# Patient Record
Sex: Male | Born: 1975 | Race: White | Hispanic: No | Marital: Married | Smoking: Former smoker
Health system: Southern US, Community
[De-identification: ages and names within clinical notes are randomized; demographics above are authoritative.]

## PROBLEM LIST (undated history)

## (undated) DIAGNOSIS — K219 Gastro-esophageal reflux disease without esophagitis: Secondary | ICD-10-CM

## (undated) DIAGNOSIS — F419 Anxiety disorder, unspecified: Secondary | ICD-10-CM

## (undated) DIAGNOSIS — I10 Essential (primary) hypertension: Secondary | ICD-10-CM

## (undated) HISTORY — DX: Anxiety disorder, unspecified: F41.9

## (undated) HISTORY — DX: Essential (primary) hypertension: I10

## (undated) HISTORY — PX: NO PAST SURGERIES: SHX2092

## (undated) HISTORY — DX: Gastro-esophageal reflux disease without esophagitis: K21.9

---

## 2012-11-30 ENCOUNTER — Ambulatory Visit (INDEPENDENT_AMBULATORY_CARE_PROVIDER_SITE_OTHER): Payer: BC Managed Care – PPO | Admitting: Emergency Medicine

## 2012-11-30 ENCOUNTER — Ambulatory Visit: Payer: BC Managed Care – PPO

## 2012-11-30 VITALS — BP 130/100 | HR 80 | Temp 98.3°F | Resp 18 | Ht 72.0 in

## 2012-11-30 DIAGNOSIS — M25579 Pain in unspecified ankle and joints of unspecified foot: Secondary | ICD-10-CM

## 2012-11-30 DIAGNOSIS — S93409A Sprain of unspecified ligament of unspecified ankle, initial encounter: Secondary | ICD-10-CM

## 2012-11-30 MED ORDER — HYDROCODONE-ACETAMINOPHEN 5-325 MG PO TABS: 1.0000 | ORAL_TABLET | ORAL | Status: DC | PRN

## 2012-11-30 NOTE — Progress Notes (Signed)
[facility] [address], [place] [address] [phone]  Date:  11/30/2012   Name:  Mark Dorsey   DOB:  1976-06-24   MRN:  [id number]  PCP:  No primary provider on file.    Chief Complaint: Ankle Pain   History of Present Illness:  Mark Dorsey is a 37 y.o. very pleasant male patient who presents with the following:  Twisted right ankle when he slipped on the ice about 2 hours ago.  Felt and heard a pop and is now unable to bear weight.    There is no problem list on file for this patient.   History reviewed. No pertinent past medical history.  History reviewed. No pertinent past surgical history.  History  Substance Use Topics  . Smoking status: Former Games developer  . Smokeless tobacco: Not on file  . Alcohol Use: Not on file    Family History  Problem Relation Age of Onset  . Diabetes Father   . Hypertension Father   . Hypertension Sister   . Hypertension Paternal Grandmother   . Asthma Paternal Grandmother     Allergies  Allergen Reactions  . Penicillins     Medication list has been reviewed and updated.  No current outpatient prescriptions on file prior to visit.    Review of Systems:  As per HPI, otherwise negative.    Physical Examination: Filed Vitals:   11/30/12 1855  BP: 130/100  Pulse: 80  Temp: 98.3 F (36.8 C)  Resp: 18   Filed Vitals:   11/30/12 1855  Height: 6' (1.829 m)   There is no weight on file to calculate BMI. Ideal Body Weight: Weight in (lb) to have BMI = 25: 183.9    GEN: WDWN, NAD, Non-toxic, Alert & Oriented x 3 HEENT: Atraumatic, Normocephalic.  Ears and Nose: No external deformity. EXTR: No clubbing/cyanosis/edema NEURO: Normal gait.  PSYCH: Normally interactive. Conversant. Not depressed or anxious appearing.  Calm demeanor.  ANKLE:  Right ankle lateral malleolus swollen and tender. No ecchymotic.  Guards too much to evaluate stability.  Assessment and Plan: Sprain ankle Air  cast Crutches WBAT vicodin Follow up in one week  Carmelina Dane, MD

## 2012-11-30 NOTE — Patient Instructions (Signed)
Ankle Sprain  An ankle sprain is an injury to the strong, fibrous tissues (ligaments) that hold the bones of your ankle joint together.   CAUSES  An ankle sprain is usually caused by a fall or by twisting your ankle. Ankle sprains most commonly occur when you step on the outer edge of your foot, and your ankle turns inward. People who participate in sports are more prone to these types of injuries.   SYMPTOMS    Pain in your ankle. The pain may be present at rest or only when you are trying to stand or walk.   Swelling.   Bruising. Bruising may develop immediately or within 1 to 2 days after your injury.   Difficulty standing or walking, particularly when turning corners or changing directions.  DIAGNOSIS   Your caregiver will ask you details about your injury and perform a physical exam of your ankle to determine if you have an ankle sprain. During the physical exam, your caregiver will press on and apply pressure to specific areas of your foot and ankle. Your caregiver will try to move your ankle in certain ways. An X-ray exam may be done to be sure a bone was not broken or a ligament did not separate from one of the bones in your ankle (avulsion fracture).   TREATMENT   Certain types of braces can help stabilize your ankle. Your caregiver can make a recommendation for this. Your caregiver may recommend the use of medicine for pain. If your sprain is severe, your caregiver may refer you to a surgeon who helps to restore function to parts of your skeletal system (orthopedist) or a physical therapist.  HOME CARE INSTRUCTIONS    Apply ice to your injury for 1 to 2 days or as directed by your caregiver. Applying ice helps to reduce inflammation and pain.   Put ice in a plastic bag.   Place a towel between your skin and the bag.   Leave the ice on for 15 to 20 minutes at a time, every 2 hours while you are awake.   Only take over-the-counter or prescription medicines for pain, discomfort, or fever as directed  by your caregiver.   Keep your injured leg elevated, when possible, to lessen swelling.   If your caregiver recommends crutches, use them as instructed. Gradually put weight on the affected ankle. Continue to use crutches or a cane until you can walk without feeling pain in your ankle.   If you have a plaster splint, wear the splint as directed by your caregiver. Do not rest it on anything harder than a pillow for the first 24 hours. Do not put weight on it. Do not get it wet. You may take it off to take a shower or bath.   You may have been given an elastic bandage to wear around your ankle to provide support. If the elastic bandage is too tight (you have numbness or tingling in your foot or your foot becomes cold and blue), adjust the bandage to make it comfortable.   If you have an air splint, you may blow more air into it or let air out to make it more comfortable. You may take your splint off at night and before taking a shower or bath.   Wiggle your toes in the splint several times per day to decrease swelling.  SEEK MEDICAL CARE IF:    You have an increase in bruising, swelling, or pain.   Your toes feel extremely cold   or you lose feeling in your foot.   Your pain is not relieved with medicine.  SEEK IMMEDIATE MEDICAL CARE IF:   Your toes are numb or blue.   You have severe pain.  MAKE SURE YOU:    Understand these instructions.   Will watch your condition.   Will get help right away if you are not doing well or get worse.  Document Released: 10/19/2005 Document Revised: 01/11/2012 Document Reviewed: 10/31/2011  ExitCare Patient Information 2013 [employer].

## 2012-12-01 ENCOUNTER — Telehealth: Payer: Self-pay
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
## 2012-12-01 NOTE — Telephone Encounter (Signed)
Patient calling again to see if he can get another splint. He would like a call back today. Please call [phone]

## 2012-12-02 NOTE — Telephone Encounter (Signed)
Can we supply another one? He may have to pay for it out of pocket, not sure the price yet

## 2012-12-02 NOTE — Telephone Encounter (Signed)
Yes we can supply another one but he will have to pay out of pocket.  They are also available for purchase at many drug stores or medical supply stores

## 2012-12-02 NOTE — Telephone Encounter (Signed)
Left message for him to call me back.  

## 2012-12-17 ENCOUNTER — Other Ambulatory Visit: Payer: Self-pay

## 2013-09-07 ENCOUNTER — Other Ambulatory Visit: Payer: Self-pay

## 2015-05-30 DIAGNOSIS — I10 Essential (primary) hypertension: Secondary | ICD-10-CM

## 2015-05-30 DIAGNOSIS — F410 Panic disorder [episodic paroxysmal anxiety] without agoraphobia: Secondary | ICD-10-CM | POA: Insufficient documentation

## 2015-05-30 HISTORY — DX: Essential (primary) hypertension: I10

## 2015-05-30 HISTORY — DX: Panic disorder (episodic paroxysmal anxiety): F41.0

## 2015-05-31 DIAGNOSIS — Z83438 Family history of other disorder of lipoprotein metabolism and other lipidemia: Secondary | ICD-10-CM

## 2015-05-31 DIAGNOSIS — Z833 Family history of diabetes mellitus: Secondary | ICD-10-CM | POA: Insufficient documentation

## 2015-05-31 DIAGNOSIS — K219 Gastro-esophageal reflux disease without esophagitis: Secondary | ICD-10-CM

## 2015-05-31 DIAGNOSIS — G43009 Migraine without aura, not intractable, without status migrainosus: Secondary | ICD-10-CM

## 2015-05-31 HISTORY — DX: Family history of diabetes mellitus: Z83.3

## 2015-05-31 HISTORY — DX: Migraine without aura, not intractable, without status migrainosus: G43.009

## 2015-05-31 HISTORY — DX: Gastro-esophageal reflux disease without esophagitis: K21.9

## 2015-05-31 HISTORY — DX: Family history of other disorder of lipoprotein metabolism and other lipidemia: Z83.438

## 2017-06-28 ENCOUNTER — Encounter: Payer: Self-pay | Admitting: Medical

## 2017-06-28 ENCOUNTER — Ambulatory Visit (INDEPENDENT_AMBULATORY_CARE_PROVIDER_SITE_OTHER): Payer: Managed Care, Other (non HMO) | Admitting: Medical

## 2017-06-28 VITALS — BP 138/80 | HR 78 | Temp 98.4°F | Resp 16 | Ht 74.0 in | Wt 280.6 lb

## 2017-06-28 DIAGNOSIS — Z23 Encounter for immunization: Secondary | ICD-10-CM

## 2017-06-28 DIAGNOSIS — Z0000 Encounter for general adult medical examination without abnormal findings: Secondary | ICD-10-CM

## 2017-06-28 DIAGNOSIS — K921 Melena: Secondary | ICD-10-CM | POA: Diagnosis not present

## 2017-06-28 DIAGNOSIS — Z0001 Encounter for general adult medical examination with abnormal findings: Secondary | ICD-10-CM

## 2017-06-28 DIAGNOSIS — Z113 Encounter for screening for infections with a predominantly sexual mode of transmission: Secondary | ICD-10-CM

## 2017-06-28 DIAGNOSIS — F419 Anxiety disorder, unspecified: Secondary | ICD-10-CM | POA: Diagnosis not present

## 2017-06-28 MED ORDER — HYDROCORTISONE ACETATE 25 MG RE SUPP: 25.0000 mg | Freq: Two times a day (BID) | RECTAL | 0 refills | Status: DC | PRN

## 2017-06-28 MED ORDER — LORAZEPAM 0.5 MG PO TABS: 0.5000 mg | ORAL_TABLET | Freq: Two times a day (BID) | ORAL | 0 refills | Status: DC | PRN

## 2017-06-28 NOTE — Patient Instructions (Addendum)
For you wellness exam today I have ordered cbc, cmp, , lipid panel, ua and hiv. And Ifob. These will be future labs so please schedule those today.  Vaccine given today was tdap. You can get a flu vaccine as nurse visit in September or October.  Recommend exercise and healthy diet.  We will let you know lab results as they come in.  I decided to refer you to GI based on the duration of your bloody stools and your mother's history of colitis. I am prescribing Anusol HC suppositories in the event you have small hemorrhoid that could be a contributing factor and you reported some rectal itching.  Some recent anxiety related to the blood in her stools. I am hopeful and think that it is likely benign condition. For safety sake referring to GI. During the interim I will prescribe 10 tablets of Ativan to use sparingly/as directed.  For your hypertension continue with losartan and for GERD continue with Zantac.  Follow up date appointment will be determined after lab review.    Preventive Care 18-39 Years, Male Preventive care refers to lifestyle choices and visits with your health care provider that can promote health and wellness. What does preventive care include?  A yearly physical exam. This is also called an annual well check.  Dental exams once or twice a year.  Routine eye exams. Ask your health care provider how often you should have your eyes checked.  Personal lifestyle choices, including: ? Daily care of your teeth and gums. ? Regular physical activity. ? Eating a healthy diet. ? Avoiding tobacco and drug use. ? Limiting alcohol use. ? Practicing safe sex. What happens during an annual well check? The services and screenings done by your health care provider during your annual well check will depend on your age, overall health, lifestyle risk factors, and family history of disease. Counseling Your health care provider may ask you questions about your:  Alcohol  use.  Tobacco use.  Drug use.  Emotional well-being.  Home and relationship well-being.  Sexual activity.  Eating habits.  Work and work Statistician.  Screening You may have the following tests or measurements:  Height, weight, and BMI.  Blood pressure.  Lipid and cholesterol levels. These may be checked every 5 years starting at age 58.  Diabetes screening. This is done by checking your blood sugar (glucose) after you have not eaten for a while (fasting).  Skin check.  Hepatitis C blood test.  Hepatitis B blood test.  Sexually transmitted disease (STD) testing.  Discuss your test results, treatment options, and if necessary, the need for more tests with your health care provider. Vaccines Your health care provider may recommend certain vaccines, such as:  Influenza vaccine. This is recommended every year.  Tetanus, diphtheria, and acellular pertussis (Tdap, Td) vaccine. You may need a Td booster every 10 years.  Varicella vaccine. You may need this if you have not been vaccinated.  HPV vaccine. If you are 20 or younger, you may need three doses over 6 months.  Measles, mumps, and rubella (MMR) vaccine. You may need at least one dose of MMR.You may also need a second dose.  Pneumococcal 13-valent conjugate (PCV13) vaccine. You may need this if you have certain conditions and have not been vaccinated.  Pneumococcal polysaccharide (PPSV23) vaccine. You may need one or two doses if you smoke cigarettes or if you have certain conditions.  Meningococcal vaccine. One dose is recommended if you are age 78-21 years and  a Market researcher living in a residence hall, or if you have one of several medical conditions. You may also need additional booster doses.  Hepatitis A vaccine. You may need this if you have certain conditions or if you travel or work in places where you may be exposed to hepatitis A.  Hepatitis B vaccine. You may need this if you have  certain conditions or if you travel or work in places where you may be exposed to hepatitis B.  Haemophilus influenzae type b (Hib) vaccine. You may need this if you have certain risk factors.  Talk to your health care provider about which screenings and vaccines you need and how often you need them. This information is not intended to replace advice given to you by your health care provider. Make sure you discuss any questions you have with your health care provider. Document Released: 12/15/2001 Document Revised: 07/08/2016 Document Reviewed: 08/20/2015 Elsevier Interactive Patient Education  2017 [employer].

## 2017-06-28 NOTE — Progress Notes (Signed)
Subjective:    Patient ID: Mark Dorsey, male    DOB: February 09, 1976, 41 y.o.   MRN: [id number]  HPI  Pt here for first time.  Pt works Engineer, drilling. No regular exercise, coffee about 1.5 cups a day, soda 1-2 times a week. Beer 2-3 times a week. Married- one daughter.  Pt has htn history controlled losartan 100 mg a day. On med for 2 years.  Pt also has some gerd. He is on zantac.   He has some anxiety for which he does breathing exercises. He was on ativan for about  2 years ago. He did not do well with sertraline. Bad side effects. Patient states that he's done well with his anxiety but the recent blood in his stools as described on review of systems is causing him some obvious distress.  Pt is not fasting.   Pt needs up to date on tdap.  He is willing to get hiv screen.  Pt mom may have had colitis. Pt not sure. No hx of cancer.   Review of Systems  Gastrointestinal: Negative for abdominal distention, anal bleeding, blood in stool, constipation, diarrhea, nausea, rectal pain and vomiting.       2 weeks ago he felt constipated. Then afterwards  he states he saw small bright red spec blood on toilet paper. He thought it was a lot. Every time he went to bathroom would see some blood on toilet paper. Mild sore around rectum. But then blood went away 3 days ago.  Also some rectal itching..  But had daily bright red blood mixed with stools for 10 days.    Past Medical History:  Diagnosis Date  . GERD (gastroesophageal reflux disease)   . Hypertension      Social History   Social History  . Marital status: Married    Spouse name: N/A  . Number of children: N/A  . Years of education: N/A   Occupational History  . Not on file.   Social History Main Topics  . Smoking status: Former Research scientist (life sciences)  . Smokeless tobacco: Never Used  . Alcohol use Yes  . Drug use: Unknown  . Sexual activity: Not on file   Other Topics Concern  . Not on file   Social History  Narrative  . No narrative on file    No past surgical history on file.  Family History  Problem Relation Age of Onset  . Diabetes Father   . Hypertension Father   . Hypertension Sister   . Hypertension Paternal Grandmother   . Asthma Paternal Grandmother     Allergies  Allergen Reactions  . Penicillins     Current Outpatient Prescriptions on File Prior to Visit  Medication Sig Dispense Refill  . Ascorbic Acid (VITAMIN C) 100 MG tablet Take 100 mg by mouth daily.    Marland Kitchen aspirin-acetaminophen-caffeine (EXCEDRIN MIGRAINE) 250-250-65 MG per tablet Take 1 tablet by mouth every 6 (six) hours as needed.    Marland Kitchen HYDROcodone-acetaminophen (NORCO) 5-325 MG per tablet Take 1 tablet by mouth every 4 (four) hours as needed for pain. 30 tablet 0   No current facility-administered medications on file prior to visit.     BP (!) 124/92   Pulse 78   Temp 98.4 F (36.9 C) (Oral)   Resp 16   Ht 6\' 2"  (1.88 m)   Wt 280 lb 9.6 oz (127.3 kg)   SpO2 99%   BMI 36.03 kg/m       Objective:  Physical Exam  General Mental Status- Alert. General Appearance- Not in acute distress.   Skin General: Color- Normal Color. Moisture- Normal Moisture.  Neck Carotid Arteries- Normal color. Moisture- Normal Moisture. No carotid bruits. No JVD.  Chest and Lung Exam Auscultation: Breath Sounds:-Normal.  Cardiovascular Auscultation:Rythm- Regular. Murmurs & Other Heart Sounds:Auscultation of the heart reveals- No Murmurs.  Abdomen Inspection:-Inspeection Normal. Palpation/Percussion:Note:No mass. Palpation and Percussion of the abdomen reveal- Non Tender, Non Distended + BS, no rebound or guarding.   Neurologic Cranial Nerve exam:- CN III-XII intact(No nystagmus), symmetric smile. Strength:- 5/5 equal and symmetric strength both upper and lower extremities.   Genital exam-normal and no hernias.  Rectal- normal sphincter. No hemorrhoids. No masses. hemocult card Negative for blood. The  rectal area was very tender on digital rectal exam. On close inspection it appears he might has very tiny hemorrhoids. Numbering maybe 3 at this time.     Assessment & Plan:   For you wellness exam today I have ordered cbc, cmp, , lipid panel, ua and hiv. And Ifob. These will be future labs so please schedule those today.  Vaccine given today was tdap. You can get a flu vaccine as nurse visit in September or October.  Recommend exercise and healthy diet.  We will let you know lab results as they come in.  I decided to refer you to GI based on the duration of your bloody stools and your mother's history of colitis. I am prescribing Anusol HC suppositories in the event you have small hemorrhoid that could be a contributing factor and you reported some rectal itching.  Some recent anxiety related to the blood in her stools. I am hopeful and think that it is likely benign condition. For safety sake referring to GI. During the interim I will prescribe 10 tablets of Ativan to use sparingly/as directed.  For your hypertension continue with losartan and for GERD continue with Zantac.  Follow up date appointment will be determined after lab review.

## 2017-06-29 ENCOUNTER — Other Ambulatory Visit: Payer: Managed Care, Other (non HMO)

## 2017-06-30 ENCOUNTER — Telehealth: Payer: Self-pay | Admitting: Medical

## 2017-06-30 ENCOUNTER — Other Ambulatory Visit (INDEPENDENT_AMBULATORY_CARE_PROVIDER_SITE_OTHER): Payer: Managed Care, Other (non HMO)

## 2017-06-30 ENCOUNTER — Encounter: Payer: Self-pay | Admitting: Medical

## 2017-06-30 ENCOUNTER — Encounter: Payer: Self-pay | Admitting: Nurse Practitioner

## 2017-06-30 DIAGNOSIS — Z0000 Encounter for general adult medical examination without abnormal findings: Secondary | ICD-10-CM | POA: Diagnosis not present

## 2017-06-30 DIAGNOSIS — R319 Hematuria, unspecified: Secondary | ICD-10-CM

## 2017-06-30 DIAGNOSIS — Z113 Encounter for screening for infections with a predominantly sexual mode of transmission: Secondary | ICD-10-CM | POA: Diagnosis not present

## 2017-06-30 LAB — POC URINALSYSI DIPSTICK (AUTOMATED)
Bilirubin, UA: NEGATIVE
GLUCOSE UA: NEGATIVE
Ketones, UA: NEGATIVE
Leukocytes, UA: NEGATIVE
Nitrite, UA: NEGATIVE
PH UA: 6 (ref 5.0–8.0)
Protein, UA: NEGATIVE
UROBILINOGEN UA: 0.2 U/dL

## 2017-06-30 LAB — CBC WITH DIFFERENTIAL/PLATELET
BASOS ABS: 0.1 10*3/uL (ref 0.0–0.1)
Basophils Relative: 1.1 % (ref 0.0–3.0)
EOS ABS: 0.2 10*3/uL (ref 0.0–0.7)
Eosinophils Relative: 3.1 % (ref 0.0–5.0)
HEMATOCRIT: 45.1 % (ref 39.0–52.0)
HEMOGLOBIN: 15 g/dL (ref 13.0–17.0)
LYMPHS PCT: 30.7 % (ref 12.0–46.0)
Lymphs Abs: 1.8 10*3/uL (ref 0.7–4.0)
MCHC: 33.3 g/dL (ref 30.0–36.0)
MCV: 87.6 fl (ref 78.0–100.0)
MONOS PCT: 9.5 % (ref 3.0–12.0)
Monocytes Absolute: 0.6 10*3/uL (ref 0.1–1.0)
Neutro Abs: 3.2 10*3/uL (ref 1.4–7.7)
Neutrophils Relative %: 55.6 % (ref 43.0–77.0)
PLATELETS: 277 10*3/uL (ref 150.0–400.0)
RBC: 5.15 Mil/uL (ref 4.22–5.81)
RDW: 14.5 % (ref 11.5–15.5)
WBC: 5.8 10*3/uL (ref 4.0–10.5)

## 2017-06-30 LAB — LIPID PANEL
CHOL/HDL RATIO: 6
CHOLESTEROL: 178 mg/dL (ref 0–200)
HDL: 29.9 mg/dL — ABNORMAL LOW (ref >39.00)
NONHDL: 148.59
Triglycerides: 207 mg/dL — ABNORMAL HIGH (ref 0.0–149.0)
VLDL: 41.4 mg/dL — ABNORMAL HIGH (ref 0.0–40.0)

## 2017-06-30 LAB — COMPREHENSIVE METABOLIC PANEL
ALBUMIN: 4.2 g/dL (ref 3.5–5.2)
ALK PHOS: 51 U/L (ref 39–117)
ALT: 27 U/L (ref 0–53)
AST: 18 U/L (ref 0–37)
BILIRUBIN TOTAL: 0.5 mg/dL (ref 0.2–1.2)
BUN: 10 mg/dL (ref 6–23)
CALCIUM: 9.4 mg/dL (ref 8.4–10.5)
CO2: 30 meq/L (ref 19–32)
CREATININE: 0.94 mg/dL (ref 0.40–1.50)
Chloride: 104 mEq/L (ref 96–112)
GFR: 94.87 mL/min (ref >60.00)
Glucose, Bld: 97 mg/dL (ref 70–99)
Potassium: 3.9 mEq/L (ref 3.5–5.1)
Sodium: 140 mEq/L (ref 135–145)
TOTAL PROTEIN: 7.1 g/dL (ref 6.0–8.3)

## 2017-06-30 LAB — LDL CHOLESTEROL, DIRECT: LDL DIRECT: 98 mg/dL

## 2017-06-30 LAB — HIV ANTIBODY (ROUTINE TESTING W REFLEX): HIV: NONREACTIVE

## 2017-06-30 NOTE — Telephone Encounter (Signed)
Will you go ahead and put patient's urine poct in so he can repeat that in 10 days after hydration. I'll sign it if you send to me.

## 2017-06-30 NOTE — Telephone Encounter (Signed)
Patient aware of need for U/A in 10d/he said that he would stop by one morning OTW to work in 10d and to let him know of other lab results as they come in/thx dmf

## 2017-06-30 NOTE — Addendum Note (Signed)
Addended by: Hinton Dyer on: 06/30/2017 01:18 PM   Modules accepted: Orders

## 2017-06-30 NOTE — Addendum Note (Signed)
Addended by: Caffie Pinto on: 06/30/2017 09:22 AM   Modules accepted: Orders

## 2017-06-30 NOTE — Addendum Note (Signed)
Addended by: Caffie Pinto on: 06/30/2017 07:31 AM   Modules accepted: Orders

## 2017-07-01 ENCOUNTER — Telehealth: Payer: Self-pay

## 2017-07-01 LAB — URINE CULTURE

## 2017-07-01 NOTE — Telephone Encounter (Signed)
Called and informed Pt of the following   We did a urine culture to see if that could've been the cause for trace blood. The culture really came back negative/normal. Only 10,000 bacteria seen. This is a very low count. Typically for urinary tract infection 100,000 bacteria is seen. The number of bacteria you have very commonly found on cultures without infection.  Pt stated "that's great, that's fine" and mentioned keeping an upcoming appointment. I advised Pt to come in if any new signs or symptoms occurred and to call if he had any further questions or concerns. Pt stated compliance.

## 2017-07-01 NOTE — Telephone Encounter (Signed)
-----   Message from Mackie Pai, PA-C sent at 07/01/2017  2:58 PM EDT ----- We did a urine culture to see if that could've been the cause for trace blood. The culture really came back negative/normal. Only 10,000 bacteria seen. This is a very low count. Typically for urinary tract infection 100,000 bacteria is seen. The number of bacteria you have very commonly found on cultures without infection.

## 2017-07-06 ENCOUNTER — Other Ambulatory Visit (INDEPENDENT_AMBULATORY_CARE_PROVIDER_SITE_OTHER): Payer: Managed Care, Other (non HMO)

## 2017-07-06 DIAGNOSIS — K921 Melena: Secondary | ICD-10-CM

## 2017-07-06 LAB — FECAL OCCULT BLOOD, IMMUNOCHEMICAL: FECAL OCCULT BLD: NEGATIVE

## 2017-07-14 ENCOUNTER — Ambulatory Visit: Payer: Managed Care, Other (non HMO) | Admitting: Nurse Practitioner

## 2017-08-09 ENCOUNTER — Encounter: Payer: Self-pay | Admitting: Medical

## 2017-08-09 MED ORDER — LOSARTAN POTASSIUM 100 MG PO TABS: 100.0000 mg | ORAL_TABLET | Freq: Every day | ORAL | 5 refills | Status: DC

## 2017-08-11 ENCOUNTER — Encounter: Payer: Self-pay | Admitting: Medical

## 2017-08-25 ENCOUNTER — Encounter: Payer: Self-pay | Admitting: Medical

## 2017-09-14 ENCOUNTER — Encounter: Payer: Self-pay | Admitting: Medical

## 2017-10-26 ENCOUNTER — Encounter: Payer: Self-pay | Admitting: Medical

## 2017-10-26 ENCOUNTER — Telehealth: Payer: Managed Care, Other (non HMO) | Admitting: Nurse Practitioner

## 2017-10-26 DIAGNOSIS — M545 Low back pain, unspecified: Secondary | ICD-10-CM

## 2017-10-26 NOTE — Progress Notes (Signed)
Based on what you shared with me it looks like you have a serious condition that should be evaluated in a face to face office visit.  NOTE: Even if you have entered your credit card information for this eVisit, you will not be charged.   * I am sorry but we cannot do pain medication in an e visit. May  Need to just go to er if pain is that bad.   If you are having a true medical emergency please call 911.  If you need an urgent face to face visit, [facility] has four urgent care centers for your convenience.  If you need care fast and have a high deductible or no insurance consider:   [email or web address]  [phone]  [address], [place] [address] 8 am to 8 pm Monday-Friday 10 am to 4 pm Saturday-Sunday   The following sites will take your  insurance:    . [facility] [place] a Provider at this Location  [address] [place], [place] [address] . 10 am to 8 pm Monday-Friday . 12 pm to 8 pm Saturday-Sunday   . [facility] [place] a Provider at this Location  [address], [address], [place] [address] . 8 am to 8 pm Monday-Friday . 9 am to 6 pm Saturday . 11 am to 6 pm Sunday   . [facility] [place] Get Driving Directions  [address] [place], [place] [address] . 8 am to 8 pm Monday-Friday . 8 am to 4 pm Saturday-Sunday   Your e-visit answers were reviewed by a board certified advanced clinical practitioner to complete your personal care plan.  Thank you for using e-Visits.

## 2017-10-27 ENCOUNTER — Encounter: Payer: Self-pay | Admitting: Medical

## 2017-10-27 ENCOUNTER — Ambulatory Visit: Payer: Managed Care, Other (non HMO) | Admitting: Family Medicine

## 2017-10-27 ENCOUNTER — Encounter: Payer: Self-pay | Admitting: Family Medicine

## 2017-10-27 VITALS — BP 120/90 | HR 67 | Temp 98.8°F | Ht 73.0 in | Wt 279.0 lb

## 2017-10-27 DIAGNOSIS — M545 Low back pain, unspecified: Secondary | ICD-10-CM

## 2017-10-27 MED ORDER — CYCLOBENZAPRINE HCL 10 MG PO TABS: 10.0000 mg | ORAL_TABLET | Freq: Three times a day (TID) | ORAL | 0 refills | Status: DC | PRN

## 2017-10-27 MED ORDER — NAPROXEN 500 MG PO TABS: 500.0000 mg | ORAL_TABLET | Freq: Two times a day (BID) | ORAL | 0 refills | Status: DC

## 2017-10-27 NOTE — Patient Instructions (Addendum)
Heat (pad or rice pillow in microwave) over affected area, 10-15 minutes every 2-3 hours while awake.   Ice/cold pack over area for 10-15 min every 2-3 hours while awake.  OK to take Tylenol 1000 mg (2 extra strength tabs) or 975 mg (3 regular strength tabs) every 6 hours as needed.  EXERCISES  RANGE OF MOTION (ROM) AND STRETCHING EXERCISES - Low Back Prain Most people with lower back pain will find that their symptoms get worse with excessive bending forward (flexion) or arching at the lower back (extension). The exercises that will help resolve your symptoms will focus on the opposite motion.  If you have pain, numbness or tingling which travels down into your buttocks, leg or foot, the goal of the therapy is for these symptoms to move closer to your back and eventually resolve. Sometimes, these leg symptoms will get better, but your lower back pain may worsen. This is often an indication of progress in your rehabilitation. Be very alert to any changes in your symptoms and the activities in which you participated in the 24 hours prior to the change. Sharing this information with your caregiver will allow him or her to most efficiently treat your condition. These exercises may help you when beginning to rehabilitate your injury. Your symptoms may resolve with or without further involvement from your physician, physical therapist or athletic trainer. While completing these exercises, remember:   Restoring tissue flexibility helps normal motion to return to the joints. This allows healthier, less painful movement and activity.  An effective stretch should be held for at least 30 seconds.  A stretch should never be painful. You should only feel a gentle lengthening or release in the stretched tissue. FLEXION RANGE OF MOTION AND STRETCHING EXERCISES:  STRETCH - Flexion, Single Knee to Chest   Lie on a firm bed or floor with both legs extended in front of you.  Keeping one leg in contact with the  floor, bring your opposite knee to your chest. Hold your leg in place by either grabbing behind your thigh or at your knee.  Pull until you feel a gentle stretch in your low back. Hold 30 seconds.  Slowly release your grasp and repeat the exercise with the opposite side. Repeat 2 times. Complete this exercise 3 times per week.   STRETCH - Flexion, Double Knee to Chest  Lie on a firm bed or floor with both legs extended in front of you.  Keeping one leg in contact with the floor, bring your opposite knee to your chest.  Tense your stomach muscles to support your back and then lift your other knee to your chest. Hold your legs in place by either grabbing behind your thighs or at your knees.  Pull both knees toward your chest until you feel a gentle stretch in your low back. Hold 30 seconds.  Tense your stomach muscles and slowly return one leg at a time to the floor. Repeat 2 times. Complete this exercise 3 times per week.   STRETCH - Low Trunk Rotation  Lie on a firm bed or floor. Keeping your legs in front of you, bend your knees so they are both pointed toward the ceiling and your feet are flat on the floor.  Extend your arms out to the side. This will stabilize your upper body by keeping your shoulders in contact with the floor.  Gently and slowly drop both knees together to one side until you feel a gentle stretch in your low back.  Hold for 30 seconds.  Tense your stomach muscles to support your lower back as you bring your knees back to the starting position. Repeat the exercise to the other side. Repeat 2 times. Complete this exercise at least 3 times per week.   EXTENSION RANGE OF MOTION AND FLEXIBILITY EXERCISES:  STRETCH - Extension, Prone on Elbows   Lie on your stomach on the floor, a bed will be too soft. Place your palms about shoulder width apart and at the height of your head.  Place your elbows under your shoulders. If this is too painful, stack pillows under your  chest.  Allow your body to relax so that your hips drop lower and make contact more completely with the floor.  Hold this position for 30 seconds.  Slowly return to lying flat on the floor. Repeat 2 times. Complete this exercise 3 times per week.   RANGE OF MOTION - Extension, Prone Press Ups  Lie on your stomach on the floor, a bed will be too soft. Place your palms about shoulder width apart and at the height of your head.  Keeping your back as relaxed as possible, slowly straighten your elbows while keeping your hips on the floor. You may adjust the placement of your hands to maximize your comfort. As you gain motion, your hands will come more underneath your shoulders.  Hold this position 30 seconds.  Slowly return to lying flat on the floor. Repeat 2 times. Complete this exercise 3 times per week.   RANGE OF MOTION- Quadruped, Neutral Spine   Assume a hands and knees position on a firm surface. Keep your hands under your shoulders and your knees under your hips. You may place padding under your knees for comfort.  Drop your head and point your tailbone toward the ground below you. This will round out your lower back like an angry cat. Hold this position for 30 seconds.  Slowly lift your head and release your tail bone so that your back sags into a large arch, like an old horse.  Hold this position for 30 seconds.  Repeat this until you feel limber in your low back.  Now, find your "sweet spot." This will be the most comfortable position somewhere between the two previous positions. This is your neutral spine. Once you have found this position, tense your stomach muscles to support your low back.  Hold this position for 30 seconds. Repeat 2 times. Complete this exercise 3 times per week.   STRENGTHENING EXERCISES - Low Back Sprain These exercises may help you when beginning to rehabilitate your injury. These exercises should be done near your "sweet spot." This is the  neutral, low-back arch, somewhere between fully rounded and fully arched, that is your least painful position. When performed in this safe range of motion, these exercises can be used for people who have either a flexion or extension based injury. These exercises may resolve your symptoms with or without further involvement from your physician, physical therapist or athletic trainer. While completing these exercises, remember:   Muscles can gain both the endurance and the strength needed for everyday activities through controlled exercises.  Complete these exercises as instructed by your physician, physical therapist or athletic trainer. Increase the resistance and repetitions only as guided.  You may experience muscle soreness or fatigue, but the pain or discomfort you are trying to eliminate should never worsen during these exercises. If this pain does worsen, stop and make certain you are following the directions  exactly. If the pain is still present after adjustments, discontinue the exercise until you can discuss the trouble with your caregiver.  STRENGTHENING - Deep Abdominals, Pelvic Tilt   Lie on a firm bed or floor. Keeping your legs in front of you, bend your knees so they are both pointed toward the ceiling and your feet are flat on the floor.  Tense your lower abdominal muscles to press your low back into the floor. This motion will rotate your pelvis so that your tail bone is scooping upwards rather than pointing at your feet or into the floor. With a gentle tension and even breathing, hold this position for 3 seconds. Repeat 2 times. Complete this exercise 3 times per week.   STRENGTHENING - Abdominals, Crunches   Lie on a firm bed or floor. Keeping your legs in front of you, bend your knees so they are both pointed toward the ceiling and your feet are flat on the floor. Cross your arms over your chest.  Slightly tip your chin down without bending your neck.  Tense your abdominals  and slowly lift your trunk high enough to just clear your shoulder blades. Lifting higher can put excessive stress on the lower back and does not further strengthen your abdominal muscles.  Control your return to the starting position. Repeat 2 times. Complete this exercise 3 times per week.   STRENGTHENING - Quadruped, Opposite UE/LE Lift   Assume a hands and knees position on a firm surface. Keep your hands under your shoulders and your knees under your hips. You may place padding under your knees for comfort.  Find your neutral spine and gently tense your abdominal muscles so that you can maintain this position. Your shoulders and hips should form a rectangle that is parallel with the floor and is not twisted.  Keeping your trunk steady, lift your right hand no higher than your shoulder and then your left leg no higher than your hip. Make sure you are not holding your breath. Hold this position for 30 seconds.  Continuing to keep your abdominal muscles tense and your back steady, slowly return to your starting position. Repeat with the opposite arm and leg. Repeat 2 times. Complete this exercise 3 times per week.   STRENGTHENING - Abdominals and Quadriceps, Straight Leg Raise   Lie on a firm bed or floor with both legs extended in front of you.  Keeping one leg in contact with the floor, bend the other knee so that your foot can rest flat on the floor.  Find your neutral spine, and tense your abdominal muscles to maintain your spinal position throughout the exercise.  Slowly lift your straight leg off the floor about 6 inches for a count of 3, making sure to not hold your breath.  Still keeping your neutral spine, slowly lower your leg all the way to the floor. Repeat this exercise with each leg 2 times. Complete this exercise 3 times per week.  POSTURE AND BODY MECHANICS CONSIDERATIONS - Low Back Sprain Keeping correct posture when sitting, standing or completing your activities  will reduce the stress put on different body tissues, allowing injured tissues a chance to heal and limiting painful experiences. The following are general guidelines for improved posture.  While reading these guidelines, remember:  The exercises prescribed by your provider will help you have the flexibility and strength to maintain correct postures.  The correct posture provides the best environment for your joints to work. All of your joints have  less wear and tear when properly supported by a spine with good posture. This means you will experience a healthier, less painful body.  Correct posture must be practiced with all of your activities, especially prolonged sitting and standing. Correct posture is as important when doing repetitive low-stress activities (typing) as it is when doing a single heavy-load activity (lifting).  RESTING POSITIONS Consider which positions are most painful for you when choosing a resting position. If you have pain with flexion-based activities (sitting, bending, stooping, squatting), choose a position that allows you to rest in a less flexed posture. You would want to avoid curling into a fetal position on your side. If your pain worsens with extension-based activities (prolonged standing, working overhead), avoid resting in an extended position such as sleeping on your stomach. Most people will find more comfort when they rest with their spine in a more neutral position, neither too rounded nor too arched. Lying on a non-sagging bed on your side with a pillow between your knees, or on your back with a pillow under your knees will often provide some relief. Keep in mind, being in any one position for a prolonged period of time, no matter how correct your posture, can still lead to stiffness.  PROPER SITTING POSTURE In order to minimize stress and discomfort on your spine, you must sit with correct posture. Sitting with good posture should be effortless for a healthy body.  Returning to good posture is a gradual process. Many people can work toward this most comfortably by using various supports until they have the flexibility and strength to maintain this posture on their own. When sitting with proper posture, your ears will fall over your shoulders and your shoulders will fall over your hips. You should use the back of the chair to support your upper back. Your lower back will be in a neutral position, just slightly arched. You may place a small pillow or folded towel at the base of your lower back for  support.  When working at a desk, create an environment that supports good, upright posture. Without extra support, muscles tire, which leads to excessive strain on joints and other tissues. Keep these recommendations in mind:  CHAIR:  A chair should be able to slide under your desk when your back makes contact with the back of the chair. This allows you to work closely.  The chair's height should allow your eyes to be level with the upper part of your monitor and your hands to be slightly lower than your elbows.  BODY POSITION  Your feet should make contact with the floor. If this is not possible, use a foot rest.  Keep your ears over your shoulders. This will reduce stress on your neck and low back.  INCORRECT SITTING POSTURES  If you are feeling tired and unable to assume a healthy sitting posture, do not slouch or slump. This puts excessive strain on your back tissues, causing more damage and pain. Healthier options include:  Using more support, like a lumbar pillow.  Switching tasks to something that requires you to be upright or walking.  Talking a brief walk.  Lying down to rest in a neutral-spine position.  PROLONGED STANDING WHILE SLIGHTLY LEANING FORWARD  When completing a task that requires you to lean forward while standing in one place for a long time, place either foot up on a stationary 2-4 inch high object to help maintain the best posture.  When both feet are on the ground, the  lower back tends to lose its slight inward curve. If this curve flattens (or becomes too large), then the back and your other joints will experience too much stress, tire more quickly, and can cause pain.  CORRECT STANDING POSTURES Proper standing posture should be assumed with all daily activities, even if they only take a few moments, like when brushing your teeth. As in sitting, your ears should fall over your shoulders and your shoulders should fall over your hips. You should keep a slight tension in your abdominal muscles to brace your spine. Your tailbone should point down to the ground, not behind your body, resulting in an over-extended swayback posture.   INCORRECT STANDING POSTURES  Common incorrect standing postures include a forward head, locked knees and/or an excessive swayback. WALKING Walk with an upright posture. Your ears, shoulders and hips should all line-up.  PROLONGED ACTIVITY IN A FLEXED POSITION When completing a task that requires you to bend forward at your waist or lean over a low surface, try to find a way to stabilize 3 out of 4 of your limbs. You can place a hand or elbow on your thigh or rest a knee on the surface you are reaching across. This will provide you more stability, so that your muscles do not tire as quickly. By keeping your knees relaxed, or slightly bent, you will also reduce stress across your lower back. CORRECT LIFTING TECHNIQUES  DO :  Assume a wide stance. This will provide you more stability and the opportunity to get as close as possible to the object which you are lifting.  Tense your abdominals to brace your spine. Bend at the knees and hips. Keeping your back locked in a neutral-spine position, lift using your leg muscles. Lift with your legs, keeping your back straight.  Test the weight of unknown objects before attempting to lift them.  Try to keep your elbows locked down at your sides in order get the  best strength from your shoulders when carrying an object.     Always ask for help when lifting heavy or awkward objects. INCORRECT LIFTING TECHNIQUES DO NOT:   Lock your knees when lifting, even if it is a small object.  Bend and twist. Pivot at your feet or move your feet when needing to change directions.  Assume that you can safely pick up even a paperclip without proper posture.

## 2017-10-27 NOTE — Progress Notes (Signed)
Musculoskeletal Exam  Patient: Mark Dorsey DOB: 1976/03/29  DOS: 10/27/2017  SUBJECTIVE:  Chief Complaint:   Chief Complaint  Patient presents with  . Back Pain    Mark Dorsey is a 41 y.o.  male for evaluation and treatment of his back pain.   Onset:  1 week ago. Was sitting against window seat on a plane.  Location: lower L Character:  aching and sharp  Progression of issue:  is unchanged Associated symptoms: none Denies bowel/bladder incontinence or weakness Treatment: to date has been heat, Tylenol, lidocaine.   Neurovascular symptoms: no  ROS: Gastrointestinal: no bowel incontinence Genitourinary: No bladder incontinence Musculoskeletal/Extremities: +back pain Neurologic: no numbness, tingling no weakness   Past Medical History:  Diagnosis Date  . Anxiety   . GERD (gastroesophageal reflux disease)   . Hypertension    Objective:  VITAL SIGNS: BP 120/90 (BP Location: Left Arm, Patient Position: Sitting, Cuff Size: Large)   Pulse 67   Temp 98.8 F (37.1 C) (Oral)   Ht 6\' 1"  (1.854 m)   Wt 279 lb (126.6 kg)   SpO2 97%   BMI 36.81 kg/m  Constitutional: Well formed, well developed. No acute distress. HENT: Normocephalic, atraumatic.  Moist mucous membranes.   Extremities: No clubbing. No cyanosis. No edema.  Skin: Warm. Dry. No erythema. No rash.  Musculoskeletal: low back.   Tenderness to palpation: yes, over lumbar paraspinal msc Deformity: no Ecchymosis: no Straight leg test: negative for  [place]: Neg b/l Neurologic: Normal sensory function. No focal deficits noted. DTR's equal and symmetry in LE's. No clonus. Psychiatric: Normal mood. Age appropriate judgment and insight. Alert & oriented x 3.    Assessment:  Acute left-sided low back pain without sciatica - Plan: cyclobenzaprine (FLEXERIL) 10 MG tablet, naproxen (NAPROSYN) 500 MG tablet  Plan: Orders as above. Do not take msc relaxant if it makes drowsy. Tylenol. Heat. Ice. Home  stretches/exercises. The patient voiced understanding and agreement to the plan.   [place], DO 10/27/17  10:07 AM

## 2017-10-27 NOTE — Progress Notes (Signed)
Pre visit review using our clinic review tool, if applicable. No additional management support is needed unless otherwise documented below in the visit note. 

## 2017-11-03 ENCOUNTER — Ambulatory Visit: Payer: Self-pay | Admitting: Medical

## 2017-12-08 ENCOUNTER — Encounter: Payer: Self-pay | Admitting: Family Medicine

## 2017-12-09 ENCOUNTER — Encounter: Payer: Self-pay | Admitting: Family Medicine

## 2017-12-09 ENCOUNTER — Ambulatory Visit: Payer: Managed Care, Other (non HMO) | Admitting: Family Medicine

## 2017-12-09 VITALS — BP 108/67 | HR 67 | Temp 98.3°F | Ht 73.0 in | Wt 275.1 lb

## 2017-12-09 DIAGNOSIS — A084 Viral intestinal infection, unspecified: Secondary | ICD-10-CM

## 2017-12-09 MED ORDER — ONDANSETRON HCL 4 MG PO TABS: 4.0000 mg | ORAL_TABLET | Freq: Three times a day (TID) | ORAL | 0 refills | Status: DC | PRN

## 2017-12-09 NOTE — Progress Notes (Signed)
Chief Complaint  Patient presents with  . Nausea  . Diarrhea     Subjective Mark Dorsey is a 42 y.o. male who presents with vomiting and diarrhea Symptoms began 2 d ago. Patient has abdominal pain, cramping, vomiting and diarrhea Patient denies fever, arthralgias, myalgias, URI symptoms and cough Feels better overall, still having some diarrhea. No bleeding. Evaluation to date: no Sick contacts: daughter  Past Medical History:  Diagnosis Date  . Anxiety   . GERD (gastroesophageal reflux disease)   . Hypertension     Allergies  Allergen Reactions  . Penicillins     Review of Systems Constitutional:  No fevers or chills Gastrointestinal:  As noted in the HPI  Exam BP 108/67 (BP Location: Left Arm, Patient Position: Sitting, Cuff Size: Large)   Pulse 67   Temp 98.3 F (36.8 C) (Oral)   Ht 6\' 1"  (1.854 m)   Wt 275 lb 2 oz (124.8 kg)   SpO2 96%   BMI 36.30 kg/m  General:  well developed, well hydrated, in no apparent distress Skin:  warm, no pallor or diaphoresis, no rashes Throat/Pharynx:  lips and gingiva without lesion; tongue and uvula midline; non-inflamed pharynx; no exudates or postnasal drainage Neck: neck supple without adenopathy, thyromegaly, or masses Lungs:  clear to auscultation, breath sounds equal bilaterally, no respiratory distress, no wheezes Cardio:  regular rate and rhythm without murmurs Abdomen:  abdomen soft, diffuse and mild ttp; bowel sounds normal; no masses or organomegaly Extremities:  no clubbing, cyanosis, or edema Psych: Appropriate judgement/insight  Assessment and Plan  Viral gastroenteritis - Plan: ondansetron (ZOFRAN) 4 MG tablet  Orders as above. Push fluids.  F/u if fevers or bleeding.  Avoid aggravating foods, discussed BRAT diet. F/u if symptoms fail to improve, sooner if worsening. The patient voiced understanding and agreement to the plan.  [place], DO 12/09/17  8:38 AM

## 2017-12-09 NOTE — Progress Notes (Signed)
Pre visit review using our clinic review tool, if applicable. No additional management support is needed unless otherwise documented below in the visit note. 

## 2017-12-09 NOTE — Patient Instructions (Addendum)
Keep pushing fluids. Drink something with electrolytes (Gatorade) until you stop having diarrhea.   Try different foods slowly.  Let us know if you need anything.

## 2017-12-27 ENCOUNTER — Encounter: Payer: Self-pay | Admitting: Family

## 2017-12-27 ENCOUNTER — Ambulatory Visit: Payer: Managed Care, Other (non HMO) | Admitting: Family

## 2017-12-27 ENCOUNTER — Ambulatory Visit: Payer: Managed Care, Other (non HMO) | Admitting: Medical

## 2017-12-27 ENCOUNTER — Telehealth: Payer: Self-pay | Admitting: Internal Medicine

## 2017-12-27 VITALS — BP 130/90 | HR 72 | Temp 99.2°F | Resp 16 | Ht 73.0 in | Wt 283.0 lb

## 2017-12-27 DIAGNOSIS — K625 Hemorrhage of anus and rectum: Secondary | ICD-10-CM

## 2017-12-27 MED ORDER — HYDROCORTISONE ACE-PRAMOXINE 1-1 % RE FOAM: 1.0000 | Freq: Two times a day (BID) | RECTAL | 0 refills | Status: DC

## 2017-12-27 NOTE — Progress Notes (Signed)
Subjective:    Patient ID: Mark Dorsey, male    DOB: 12/23/1975, 42 y.o.   MRN: [id number]  HPI  Patient is a 42 year old male who presents today with chief complaint of rectal bleeding.  He reports that he has associated rectal pain and that his symptoms have been present for approximately 1 week. Feels "like tearing"  Was given hydrocortisone suppositories over the summer and notes that he had a few left which he though were helping some. Regular BM's no straining.  He reports that he is only having bleeding with bowel movements.  He is having approximately 1 bowel movement a day.  He notes some blood on the stool as well as some drips into the toilet.   Had similar symptoms over the summer but reports that he did not follow through with GI referral due to his work schedule.   Review of Systems See HPI  Past Medical History:  Diagnosis Date  . Anxiety   . GERD (gastroesophageal reflux disease)   . Hypertension      Social History   Socioeconomic History  . Marital status: Married    Spouse name: Not on file  . Number of children: Not on file  . Years of education: Not on file  . Highest education level: Not on file  Social Needs  . Financial resource strain: Not on file  . Food insecurity - worry: Not on file  . Food insecurity - inability: Not on file  . Transportation needs - medical: Not on file  . Transportation needs - non-medical: Not on file  Occupational History  . Not on file  Tobacco Use  . Smoking status: Former Research scientist (life sciences)  . Smokeless tobacco: Never Used  Substance and Sexual Activity  . Alcohol use: Yes    Comment: 3 beers a week.  . Drug use: No  . Sexual activity: Yes  Other Topics Concern  . Not on file  Social History Narrative  . Not on file    No past surgical history on file.  Family History  Problem Relation Age of Onset  . Nephrolithiasis Mother   . Diabetes Father   . Hypertension Father   . Hematuria Father   . Colon polyps Father    . Hypertension Sister   . Hypertension Paternal Grandmother   . Asthma Paternal Grandmother   . Hematuria Paternal Grandfather   . Colon polyps Paternal Grandfather     Allergies  Allergen Reactions  . Penicillins     Current Outpatient Medications on File Prior to Visit  Medication Sig Dispense Refill  . Ascorbic Acid (VITAMIN C) 100 MG tablet Take 100 mg by mouth daily.    . cyclobenzaprine (FLEXERIL) 10 MG tablet Take 1 tablet (10 mg total) by mouth 3 (three) times daily as needed for muscle spasms. 20 tablet 0  . hydrocortisone (ANUSOL-HC) 25 MG suppository Place 1 suppository (25 mg total) rectally 2 (two) times daily as needed for hemorrhoids or anal itching. 12 suppository 0  . LORazepam (ATIVAN) 0.5 MG tablet Take 1 tablet (0.5 mg total) by mouth 2 (two) times daily as needed for anxiety. 10 tablet 0  . losartan (COZAAR) 100 MG tablet Take 1 tablet (100 mg total) by mouth daily. 30 tablet 5  . naproxen (NAPROSYN) 500 MG tablet Take 1 tablet (500 mg total) by mouth 2 (two) times daily with a meal. 30 tablet 0  . ondansetron (ZOFRAN) 4 MG tablet Take 1 tablet (4 mg total) by mouth  every 8 (eight) hours as needed. 20 tablet 0   No current facility-administered medications on file prior to visit.     BP 130/90 (BP Location: Right Arm, Patient Position: Sitting, Cuff Size: Large)   Pulse 72   Temp 99.2 F (37.3 C) (Oral)   Resp 16   Ht 6\' 1"  (1.854 m)   Wt 283 lb (128.4 kg)   SpO2 97%   BMI 37.34 kg/m       Objective:   Physical Exam  Constitutional: He is oriented to person, place, and time. He appears well-developed and well-nourished. No distress.  HENT:  Head: Normocephalic and atraumatic.  Cardiovascular: Normal rate and regular rhythm.  No murmur heard. Pulmonary/Chest: Effort normal and breath sounds normal. No respiratory distress. He has no wheezes. He has no rales.  Abdominal: Soft. Bowel sounds are normal. He exhibits no distension and no mass. There is  no tenderness. There is no rebound and no guarding.  Abdominal exam limited by habitus.   Genitourinary: Rectal exam shows guaiac positive stool.  Musculoskeletal: He exhibits no edema.  Neurological: He is alert and oriented to person, place, and time.  Skin: Skin is warm and dry.  Psychiatric: He has a normal mood and affect. His behavior is normal. Thought content normal.  rectal:  Normal external rectal exam.  + friable, tender pea sized mobile mass noted inside rectum        Assessment & Plan:  Rectal bleeding- mobile tender mass ? Hemorrhoid noted in rectal canal.  Hgb 14.5.  He i thinks headed out of town tomorrow for a 3 day trip to [facility] for work which he states he cannot cancel.  I have advised him as follows:    Please begin proctofoam twice daily.   If you have multiple bloody stools in 1 day or if you develop weakness, shortness or breath please go to the ER. We will do our best to get you in with GI on Friday of this week for consult.   Patient verbalizes understanding and is in agreement with plan.

## 2017-12-27 NOTE — Patient Instructions (Signed)
Please begin proctofoam twice daily.   If you have multiple bloody stools in 1 day or if you develop weakness, shortness or breath please go to the ER. We will do our best to get you in with GI on Friday of this week.

## 2017-12-28 NOTE — Telephone Encounter (Signed)
From reading PCP notes looks like he is on a business trip and they were tryiing to get him in on Fri  I see an open slot on amy's schedule at 3 that day but if there is a doc that bands hemorrhoids who has an opening use that vs Amy or another APP

## 2017-12-28 NOTE — Telephone Encounter (Signed)
Patient notified of the appt date and time and will be here

## 2017-12-28 NOTE — Telephone Encounter (Signed)
Dr Carlean Purl please see referral and advise on scheduling

## 2017-12-28 NOTE — Telephone Encounter (Signed)
Mark Dorsey,  You scheduled this patient for 12/31/17. Do I need to call patient and confirm or does patient know of appointment. Thanks!

## 2017-12-31 ENCOUNTER — Ambulatory Visit: Payer: Managed Care, Other (non HMO) | Admitting: Physician Assistant

## 2017-12-31 ENCOUNTER — Encounter: Payer: Self-pay | Admitting: Physician Assistant

## 2017-12-31 VITALS — BP 142/82 | HR 72 | Ht 73.0 in | Wt 277.0 lb

## 2017-12-31 DIAGNOSIS — K625 Hemorrhage of anus and rectum: Secondary | ICD-10-CM

## 2017-12-31 DIAGNOSIS — K6289 Other specified diseases of anus and rectum: Secondary | ICD-10-CM | POA: Diagnosis not present

## 2017-12-31 MED ORDER — HYDROCORTISONE ACE-PRAMOXINE 1-1 % RE FOAM: 1.0000 | Freq: Two times a day (BID) | RECTAL | 1 refills | Status: DC

## 2017-12-31 NOTE — Progress Notes (Signed)
Subjective:    Patient ID: Mark Dorsey, male    DOB: 1975/12/08, 42 y.o.   MRN: [id number]  HPI Mark Dorsey is a 42 year old white male, new to GI today referred by Debbrah Alar NP for evaluation of rectal bleeding and possible rectal lesion. Patient has not had any prior GI evaluation. He has history of obesity, anxiety and hypertension. Patient says that he has noticed small amounts of bright red blood per rectum off and on over the past couple of years. This had been very sporadic and always just very small amount on the tissue.Marland Kitchen Not had any associated anorectal pain or pressure, no abdominal pain cramping or changes in bowel habits. He had an episode on 12/17/2017 after being up much the night with abdominal cramping and diarrhea. He started noticing some blood dripping into the commode the following day. He says this was bright red but it looked like a lot of blood. He was also feeling some rectal pressure more externally. Since that time he had been seeing bright red blood with his bowel movements on a daily basis until yesterday. He was given a prescription for Proctofoam earlier this week and feels that that has been helping. He is been using this twice daily. He is also been doing sitz baths at home and using hemorrhoidal wipes. CBC was done last week with hemoglobin of 14.5. Patient does not have family history of colon polyps or cancer. Mother does have IBS. Patient mentions that he feels a small lump on the right side of his anus intermittently.  Review of Systems Pertinent positive and negative review of systems were noted in the above HPI section.  All other review of systems was otherwise negative.  Outpatient Encounter Medications as of 12/31/2017  Medication Sig  . Ascorbic Acid (VITAMIN C) 100 MG tablet Take 100 mg by mouth daily.  . cyclobenzaprine (FLEXERIL) 10 MG tablet Take 1 tablet (10 mg total) by mouth 3 (three) times daily as needed for muscle spasms.  .  hydrocortisone (ANUSOL-HC) 25 MG suppository Place 1 suppository (25 mg total) rectally 2 (two) times daily as needed for hemorrhoids or anal itching.  . hydrocortisone-pramoxine (PROCTOFOAM HC) rectal foam Place 1 applicator rectally 2 (two) times daily.  Marland Kitchen LORazepam (ATIVAN) 0.5 MG tablet Take 1 tablet (0.5 mg total) by mouth 2 (two) times daily as needed for anxiety.  Marland Kitchen losartan (COZAAR) 100 MG tablet Take 1 tablet (100 mg total) by mouth daily.  . naproxen (NAPROSYN) 500 MG tablet Take 1 tablet (500 mg total) by mouth 2 (two) times daily with a meal.  . ondansetron (ZOFRAN) 4 MG tablet Take 1 tablet (4 mg total) by mouth every 8 (eight) hours as needed.  . [DISCONTINUED] hydrocortisone-pramoxine (PROCTOFOAM HC) rectal foam Place 1 applicator rectally 2 (two) times daily.   No facility-administered encounter medications on file as of 12/31/2017.    Allergies  Allergen Reactions  . Penicillins    There are no active problems to display for this patient.  Social History   Socioeconomic History  . Marital status: Married    Spouse name: Not on file  . Number of children: Not on file  . Years of education: Not on file  . Highest education level: Not on file  Social Needs  . Financial resource strain: Not on file  . Food insecurity - worry: Not on file  . Food insecurity - inability: Not on file  . Transportation needs - medical: Not on file  . Transportation  needs - non-medical: Not on file  Occupational History  . Not on file  Tobacco Use  . Smoking status: Former Research scientist (life sciences)  . Smokeless tobacco: Never Used  Substance and Sexual Activity  . Alcohol use: Yes    Comment: 3 beers a week.  . Drug use: No  . Sexual activity: Yes  Other Topics Concern  . Not on file  Social History Narrative  . Not on file    Mr. Burby family history includes Asthma in his paternal grandmother; Colon polyps in his father and paternal grandfather; Diabetes in his father; Hematuria in his father  and paternal grandfather; Hypertension in his father, paternal grandmother, and sister; Nephrolithiasis in his mother.      Objective:    Vitals:   12/31/17 1452  BP: (!) 142/82  Pulse: 72    Physical Exam; well-developed white male in no acute distress, pleasant, anxious. blood pressure 142/82, pulse 72, height 6 foot 1, weight 277, BMI 36.5. HEENT ;nontraumatic normocephalic EOMI PERRLA sclera anicteric, Cardiovascular; regular rate and rhythm with S1-S2. Pulmonary; clear bilaterally, Abdomen ;obese soft nontender nondistended bowel sounds are active no palpable mass or hepatosplenomegaly, Rectal; exam not repeated today, this was done on 12/27/2017 prior Debbrah Alar NP with finding of heme-positive stool, there was noted to be a pea-sized mobile lesion in the rectum question hemorrhoid. No external lesion noted. Extremities; no clubbing cyanosis or edema skin warm and dry, Neuropsych ;mood and affect appropriate       Assessment & Plan:   #19 42 year old white male with intermittent small-volume hematochezia over the past couple of years, and an episode onset on 12/17/2017 after a night of diarrhea and abdominal cramping with bright red blood noted the following day dripping in the commode. Since then has seen bright red blood with each bowel movement. Not had any significant rectal pain but has had a sensation of pressure. Rule out bleeding secondary to internal hemorrhoids, rule out rectal lesion, proctitis, occult colonic lesion  #2 anxiety #3 obesity   #4 hypertension  Plan; patient will be scheduled for colonoscopy with Dr. Havery Moros. Procedure was discussed in detail with patient including indications risks and benefits and he is agreeable to proceed. He will continue use of hemorrhoidal wipes, continue high-fiber diet and Metamucil daily. He was advised he does not need to continue sitz bath skin but can do this periodically for any flare of symptoms. We'll refill  Proctofoam for twice daily use as needed. As he has not had any bleeding over the past couple of days he may discontinue for now.  Tymesha Ditmore Genia Harold PA-C 12/31/2017   Cc: Mackie Pai, PA-C

## 2017-12-31 NOTE — Progress Notes (Signed)
Agree with assessment and plan as outlined.  

## 2017-12-31 NOTE — Patient Instructions (Signed)
We sent refills for thje Proctofoam for twice daily usage as needed. Use hot soaks in the tub with epsom salts as needed. Continue high fiber diet, Metamucil daily and drink lots of water.   You have been scheduled for a colonoscopy. Please follow written instructions given to you at your visit today.  Please pick up your prep supplies at the pharmacy within the next 1-3 days. If you use inhalers (even only as needed), please bring them with you on the day of your procedure.

## 2018-01-01 ENCOUNTER — Other Ambulatory Visit: Payer: Self-pay | Admitting: Physician Assistant

## 2018-01-03 ENCOUNTER — Other Ambulatory Visit: Payer: Self-pay | Admitting: Medical

## 2018-01-03 ENCOUNTER — Encounter: Payer: Self-pay | Admitting: Physician Assistant

## 2018-01-03 MED ORDER — LORAZEPAM 0.5 MG PO TABS: 0.5000 mg | ORAL_TABLET | Freq: Two times a day (BID) | ORAL | 0 refills | Status: DC | PRN

## 2018-01-03 NOTE — Telephone Encounter (Signed)
Pt is requesting refill on lorazepam   Last OV: 12/27/2017 w/ Melissa Last Fill: 06/28/2017 #10 and 0RF UDS: None  Please advise.

## 2018-01-03 NOTE — Telephone Encounter (Signed)
6 tablet prescription of Ativan written.  I do not know when his appointment is for colonoscopy.  He can use this for the short-term.  I would recommend using this 3 days out from the procedure but also contacting his gastroenterologist office and see if they think is okay for him to use Ativan the day of the procedure.  Recommend that he get clarification from GI with this as it might interact with medications a used to sedate him.  If he states his colonoscopy is weeks out and he is having severe anxiety then I recommend he make an appointment to see me.  As he is not officially on his contract.  Rx electronically sent to his pharmacy.

## 2018-01-09 ENCOUNTER — Encounter: Payer: Self-pay | Admitting: Physician Assistant

## 2018-01-13 ENCOUNTER — Encounter: Payer: Self-pay | Admitting: Gastroenterology

## 2018-01-13 ENCOUNTER — Encounter: Payer: Self-pay | Admitting: Physician Assistant

## 2018-01-13 ENCOUNTER — Other Ambulatory Visit: Payer: Self-pay

## 2018-01-13 MED ORDER — HYDROCORTISONE ACE-PRAMOXINE 1-1 % RE FOAM: 1.0000 | Freq: Two times a day (BID) | RECTAL | 1 refills | Status: DC

## 2018-01-18 ENCOUNTER — Encounter: Payer: Self-pay | Admitting: Physician Assistant

## 2018-01-24 ENCOUNTER — Ambulatory Visit: Payer: Managed Care, Other (non HMO) | Admitting: Physician Assistant

## 2018-01-25 ENCOUNTER — Encounter: Payer: Self-pay | Admitting: Gastroenterology

## 2018-01-25 ENCOUNTER — Encounter: Payer: Self-pay | Admitting: Medical

## 2018-01-25 ENCOUNTER — Encounter: Payer: Self-pay | Admitting: Physician Assistant

## 2018-01-26 ENCOUNTER — Encounter: Payer: Self-pay | Admitting: Gastroenterology

## 2018-01-26 ENCOUNTER — Ambulatory Visit (AMBULATORY_SURGERY_CENTER): Payer: Managed Care, Other (non HMO) | Admitting: Gastroenterology

## 2018-01-26 ENCOUNTER — Other Ambulatory Visit: Payer: Self-pay

## 2018-01-26 VITALS — BP 118/85 | HR 90 | Temp 99.8°F | Resp 16 | Ht 73.0 in | Wt 277.0 lb

## 2018-01-26 DIAGNOSIS — K621 Rectal polyp: Secondary | ICD-10-CM | POA: Diagnosis not present

## 2018-01-26 DIAGNOSIS — K625 Hemorrhage of anus and rectum: Secondary | ICD-10-CM | POA: Diagnosis present

## 2018-01-26 DIAGNOSIS — D128 Benign neoplasm of rectum: Secondary | ICD-10-CM

## 2018-01-26 DIAGNOSIS — D129 Benign neoplasm of anus and anal canal: Secondary | ICD-10-CM

## 2018-01-26 MED ORDER — SODIUM CHLORIDE 0.9 % IV SOLN: 500.0000 mL | Freq: Once | INTRAVENOUS | Status: DC

## 2018-01-26 NOTE — Progress Notes (Signed)
To PACU, VSS. Report to [email or web address] 

## 2018-01-26 NOTE — Op Note (Signed)
[place] Patient Name: Mark Dorsey Procedure Date: 01/26/2018 7:45 AM MRN: [id number] Endoscopist: Remo Lipps P. Mabry Santarelli MD, MD Age: 42 Referring MD:  Date of Birth: 07-16-1976 Gender: Male Account #: [id number] Procedure:                Colonoscopy Indications:              Rectal bleeding Medicines:                Monitored Anesthesia Care Procedure:                Pre-Anesthesia Assessment:                           - Prior to the procedure, a History and Physical                            was performed, and patient medications and                            allergies were reviewed. The patient's tolerance of                            previous anesthesia was also reviewed. The risks                            and benefits of the procedure and the sedation                            options and risks were discussed with the patient.                            All questions were answered, and informed consent                            was obtained. Prior Anticoagulants: The patient has                            taken no previous anticoagulant or antiplatelet                            agents. ASA Grade Assessment: III - A patient with                            severe systemic disease. After reviewing the risks                            and benefits, the patient was deemed in                            satisfactory condition to undergo the procedure.                           After obtaining informed consent, the colonoscope  was passed under direct vision. Throughout the                            procedure, the patient's blood pressure, pulse, and                            oxygen saturations were monitored continuously. The                            Colonoscope was introduced through the anus and                            advanced to the the terminal ileum, with                            identification of the appendiceal orifice  and IC                            valve. The colonoscopy was performed without                            difficulty. The patient tolerated the procedure                            well. The quality of the bowel preparation was                            adequate. The terminal ileum, ileocecal valve,                            appendiceal orifice, and rectum were photographed. Scope In: 8:03:17 AM Scope Out: 8:21:59 AM Scope Withdrawal Time: 0 hours 14 minutes 28 seconds  Total Procedure Duration: 0 hours 18 minutes 42 seconds  Findings:                 The perianal exam findings include skin tags.                           The terminal ileum appeared normal.                           A 3 mm polyp was found in the rectum. The polyp was                            sessile. The polyp was removed with a cold biopsy                            forceps. Resection and retrieval were complete.                           Internal hemorrhoids were found during                            retroflexion. The hemorrhoids were moderate.  The exam was otherwise without abnormality. Complications:            No immediate complications. Estimated blood loss:                            Minimal. Estimated Blood Loss:     Estimated blood loss was minimal. Impression:               - Perianal skin tags found on perianal exam.                           - The examined portion of the ileum was normal.                           - One 3 mm polyp in the rectum, removed with a cold                            biopsy forceps. Resected and retrieved.                           - Internal hemorrhoids.                           - The examination was otherwise normal.                           The patient's bleeding is likely coming from                            hemorrhoids noted on this exam. Recommendation:           - Patient has a contact number available for                             emergencies. The signs and symptoms of potential                            delayed complications were discussed with the                            patient. Return to normal activities tomorrow.                            Written discharge instructions were provided to the                            patient.                           - Resume previous diet.                           - Continue present medications.                           - Daily fiber supplement                           -  Consideration for hemorrhoid banding for more                            definitive treatment of hemorrhoids                           - Await pathology results. Remo Lipps P. Imaya Duffy MD, MD 01/26/2018 8:26:07 AM This report has been signed electronically.

## 2018-01-26 NOTE — Progress Notes (Signed)
Called to room to assist during endoscopic procedure.  Patient ID and intended procedure confirmed with present staff. Received instructions for my participation in the procedure from the performing physician.  

## 2018-01-26 NOTE — Patient Instructions (Signed)
YOU HAD AN ENDOSCOPIC PROCEDURE TODAY AT [facility]:   Refer to the procedure report that was given to you for any specific questions about what was found during the examination.  If the procedure report does not answer your questions, please call your gastroenterologist to clarify.  If you requested that your care partner not be given the details of your procedure findings, then the procedure report has been included in a sealed envelope for you to review at your convenience later.  YOU SHOULD EXPECT: Some feelings of bloating in the abdomen. Passage of more gas than usual.  Walking can help get rid of the air that was put into your GI tract during the procedure and reduce the bloating. If you had a lower endoscopy (such as a colonoscopy or flexible sigmoidoscopy) you may notice spotting of blood in your stool or on the toilet paper. If you underwent a bowel prep for your procedure, you may not have a normal bowel movement for a few days.  Please Note:  You might notice some irritation and congestion in your nose or some drainage.  This is from the oxygen used during your procedure.  There is no need for concern and it should clear up in a day or so.  SYMPTOMS TO REPORT IMMEDIATELY:   Following lower endoscopy (colonoscopy or flexible sigmoidoscopy):  Excessive amounts of blood in the stool  Significant tenderness or worsening of abdominal pains  Swelling of the abdomen that is new, acute  Fever of 100F or higher   For urgent or emergent issues, a gastroenterologist can be reached at any hour by calling [phone].   DIET:  We do recommend a small meal at first, but then you may proceed to your regular diet.  Drink plenty of fluids but you should avoid alcoholic beverages for 24 hours.  ACTIVITY:  You should plan to take it easy for the rest of today and you should NOT DRIVE or use heavy machinery until tomorrow (because of the sedation medicines used during the test).     FOLLOW UP: Our staff will call the number listed on your records the next business day following your procedure to check on you and address any questions or concerns that you may have regarding the information given to you following your procedure. If we do not reach you, we will leave a message.  However, if you are feeling well and you are not experiencing any problems, there is no need to return our call.  We will assume that you have returned to your regular daily activities without incident.  If any biopsies were taken you will be contacted by phone or by letter within the next 1-3 weeks.  Please call us at [phone] if you have not heard about the biopsies in 3 weeks.    SIGNATURES/CONFIDENTIALITY: You and/or your care partner have signed paperwork which will be entered into your electronic medical record.  These signatures attest to the fact that that the information above on your After Visit Summary has been reviewed and is understood.  Full responsibility of the confidentiality of this discharge information lies with you and/or your care-partner.  Polyp information given.  Hemorrhoid and hemorrhoid banding information given.  Fiber supplement daily.  Await pathology report and directions concerning recall colonoscopy.

## 2018-01-27 ENCOUNTER — Telehealth: Payer: Self-pay

## 2018-01-27 ENCOUNTER — Telehealth: Payer: Self-pay | Admitting: *Deleted

## 2018-01-27 NOTE — Telephone Encounter (Signed)
  Follow up Call-  Call back number 01/26/2018  Post procedure Call Back phone  # [phone]  Permission to leave phone message Yes  Some recent data might be hidden     Patient questions:  Do you have a fever, pain , or abdominal swelling? No. Pain Score  0 *  Have you tolerated food without any problems? Yes.    Have you been able to return to your normal activities? Yes.    Do you have any questions about your discharge instructions: Diet   No. Medications  No. Follow up visit  No.  Do you have questions or concerns about your Care? No.  Actions: * If pain score is 4 or above: No action needed, pain <4.

## 2018-01-27 NOTE — Telephone Encounter (Signed)
-----   Message from Larina Bras, [place] sent at 01/26/2018  2:11 PM EDT ----- Regarding: FW: schedule for banding Patient has already been scheduled for hem banding #1 and 2. Will need to be scheduled for hem banding #3 at a later date (I placed a note in hem banding #2 appt notes for this) as there is currently no availability for the time constrictions needed. Left voicemail for patient to call back. He just needs to be advised of dates. ----- Message ----- From: Yetta Flock, MD Sent: 01/26/2018   9:09 AM To: Roetta Sessions, CMA Subject: schedule for banding                           Hi Jan, Can you help schedule this patient for hemorrhoid banding, routine appointment. No rush, just performed his procedure today. Thanks

## 2018-01-27 NOTE — Telephone Encounter (Signed)
LM for pt to call back to so we can let him know when we have scheduled him for his hemorrhoidal banding appt with Dr. Havery Moros.

## 2018-01-28 NOTE — Telephone Encounter (Signed)
Thanks Jan 

## 2018-01-28 NOTE — Telephone Encounter (Signed)
Called and spoke to pt, gave dates and times of banding appts. Answered all questions.

## 2018-01-31 ENCOUNTER — Encounter: Payer: Self-pay | Admitting: Medical

## 2018-01-31 ENCOUNTER — Ambulatory Visit: Payer: Managed Care, Other (non HMO) | Admitting: Medical

## 2018-01-31 VITALS — BP 138/80 | HR 58 | Temp 98.0°F | Resp 16 | Ht 72.0 in | Wt 274.6 lb

## 2018-01-31 DIAGNOSIS — R002 Palpitations: Secondary | ICD-10-CM

## 2018-01-31 DIAGNOSIS — I493 Ventricular premature depolarization: Secondary | ICD-10-CM

## 2018-01-31 MED ORDER — LORAZEPAM 0.5 MG PO TABS: 0.5000 mg | ORAL_TABLET | Freq: Two times a day (BID) | ORAL | 0 refills | Status: DC | PRN

## 2018-01-31 NOTE — Patient Instructions (Addendum)
On your strip that you brought from your colonoscopy, it appears you had some tachycardia at times with occasional PVCs.  On today's EKG 1 of the EKGs showed sinus rhythm with pauses.  In addition you do report in the past feeling some intermittent episodes of palpitations.  Would recommend that you decrease caffeine intake and avoid any over-the-counter decongestants.  I am going to go ahead and refer you to cardiologist and they may order Holter monitor.  I am going to try to get you in this week with cardiologist upstairs.  Also for your history of intermittent anxiety, going to refill your Ativan.  Limited supply given but after this prescription if you are finding that you need a recurrent prescription then will need you to come back in for visit signed contract, and give UDS.(Note prescription of Ativan is for your baseline anxiety and not presumptive treatment for cardiac condition)  Follow-up in 1-2 months.  Sooner in the event referral to cardiologist is delayed.  If you have any cardiac signs symptoms after hours then recommend ED evaluation.

## 2018-01-31 NOTE — Progress Notes (Signed)
Subjective:    Patient ID: Mark Dorsey, male    DOB: [birth year], 42 y.o.   MRN: [id number]  HPI  Pt in for follow up.   Pt did have his colonoscopy for some bright red blood in stools. Study was negative and told hemorrhoids and one small polyp. Pt is going to get banding procedure in May, 2019.  During the procedure he had what appears to be some tachycardia on strop and appears to have pcv.   He was told to follow-up with by his anesthesiologist.  Patient does note that for many months now he will feel one palpitation and they but nothing sustained.  Not reporting any chest pain or shortness of breath during these events.  He is purposely avoiding any decongestants due to his high blood pressure.  Advised today to avoid any caffeine.     Review of Systems  Constitutional: Negative for chills, fatigue and fever.  Respiratory: Negative for cough, chest tightness, shortness of breath and wheezing.   Cardiovascular: Positive for palpitations. Negative for chest pain.  Gastrointestinal: Negative for abdominal pain, diarrhea, nausea and vomiting.  Musculoskeletal: Negative for back pain.  Skin: Negative for rash.  Neurological: Negative for dizziness, speech difficulty, weakness and headaches.  Hematological: Negative for adenopathy. Does not bruise/bleed easily.  Psychiatric/Behavioral: Negative for behavioral problems, confusion and sleep disturbance. The patient is nervous/anxious.     Past Medical History:  Diagnosis Date  . Anxiety   . GERD (gastroesophageal reflux disease)   . Hypertension      Social History   Socioeconomic History  . Marital status: Married    Spouse name: Not on file  . Number of children: Not on file  . Years of education: Not on file  . Highest education level: Not on file  Occupational History  . Not on file  Social Needs  . Financial resource strain: Not on file  . Food insecurity:    Worry: Not on file    Inability: Not on file  .  Transportation needs:    Medical: Not on file    Non-medical: Not on file  Tobacco Use  . Smoking status: Former Smoker    Types: Cigarettes    Last attempt to quit: 2007    Years since quitting: 12.2  . Smokeless tobacco: Never Used  Substance and Sexual Activity  . Alcohol use: Yes    Comment: 3 beers a week.  . Drug use: No  . Sexual activity: Yes  Lifestyle  . Physical activity:    Days per week: Not on file    Minutes per session: Not on file  . Stress: Not on file  Relationships  . Social connections:    Talks on phone: Not on file    Gets together: Not on file    Attends religious service: Not on file    Active member of club or organization: Not on file    Attends meetings of clubs or organizations: Not on file    Relationship status: Not on file  . Intimate partner violence:    Fear of current or ex partner: Not on file    Emotionally abused: Not on file    Physically abused: Not on file    Forced sexual activity: Not on file  Other Topics Concern  . Not on file  Social History Narrative  . Not on file    No past surgical history on file.  Family History  Problem Relation Age of Onset  .  Nephrolithiasis Mother   . Diabetes Father   . Hypertension Father   . Hematuria Father   . Colon polyps Father   . Hypertension Sister   . Hypertension Paternal Grandmother   . Asthma Paternal Grandmother   . Hematuria Paternal Grandfather   . Colon polyps Paternal Grandfather     Allergies  Allergen Reactions  . Penicillins     Current Outpatient Medications on File Prior to Visit  Medication Sig Dispense Refill  . cyclobenzaprine (FLEXERIL) 10 MG tablet Take 1 tablet (10 mg total) by mouth 3 (three) times daily as needed for muscle spasms. 20 tablet 0  . hydrocortisone (ANUSOL-HC) 25 MG suppository Place 1 suppository (25 mg total) rectally 2 (two) times daily as needed for hemorrhoids or anal itching. 12 suppository 0  . hydrocortisone-pramoxine (PROCTOFOAM  HC) rectal foam Place 1 applicator rectally 2 (two) times daily. 10 g 1  . losartan (COZAAR) 100 MG tablet Take 1 tablet (100 mg total) by mouth daily. 30 tablet 5  . naproxen (NAPROSYN) 500 MG tablet Take 1 tablet (500 mg total) by mouth 2 (two) times daily with a meal. 30 tablet 0  . ondansetron (ZOFRAN) 4 MG tablet Take 1 tablet (4 mg total) by mouth every 8 (eight) hours as needed. 20 tablet 0  . ranitidine (ZANTAC) 150 MG tablet Take 150 mg by mouth 2 (two) times daily.     Current Facility-Administered Medications on File Prior to Visit  Medication Dose Route Frequency Provider Last Rate Last Dose  . 0.9 %  sodium chloride infusion  500 mL Intravenous Once Armbruster, Carlota Raspberry, MD        BP 138/80   Pulse (!) 58   Temp 98 F (36.7 C) (Oral)   Resp 16   Ht 6' (1.829 m)   Wt 274 lb 9.6 oz (124.6 kg)   HC 1" (2.5 cm)   SpO2 100%   BMI 37.24 kg/m       Objective:   Physical Exam  General Mental Status- Alert. General Appearance- Not in acute distress.   Skin General: Color- Normal Color. Moisture- Normal Moisture.  Neck Carotid Arteries- Normal color. Moisture- Normal Moisture. No carotid bruits. No JVD.  Chest and Lung Exam Auscultation: Breath Sounds:-Normal.  Cardiovascular Auscultation:Rythm- Regular. Murmurs & Other Heart Sounds:Auscultation of the heart reveals- No Murmurs.  Abdomen Inspection:-Inspeection Normal. Palpation/Percussion:Note:No mass. Palpation and Percussion of the abdomen reveal- Non Tender, Non Distended + BS, no rebound or guarding.    Neurologic Cranial Nerve exam:- CN III-XII intact(No nystagmus), symmetric smile. Strength:- 5/5 equal and symmetric strength both upper and lower extremities.      Assessment & Plan:  On your strip that you brought from your colonoscopy, it appears you had some tachycardia at times with occasional PVCs.  On today's EKG 1 of the EKGs showed sinus rhythm with pauses.  In addition you do report in the  past feeling some intermittent episodes of palpitations.  Would recommend that you decrease caffeine intake and avoid any over-the-counter decongestants.  I am going to go ahead and refer you to cardiologist and they may order Holter monitor.  I am going to try to get you in this week with cardiologist upstairs.  Also for your history of intermittent anxiety, going to refill your Ativan.  Limited supply given but after this prescription if you are finding that you need a recurrent prescription then will need you to come back in for visit signed contract, and give UDS.  Follow-up in 1-2 months.  Sooner in the event referral to cardiologist is delayed.  If you have any cardiac signs symptoms after hours then recommend ED evaluation.  Mackie Pai, PA-C

## 2018-02-02 ENCOUNTER — Encounter: Payer: Self-pay | Admitting: Gastroenterology

## 2018-02-04 ENCOUNTER — Encounter: Payer: Self-pay | Admitting: Medical

## 2018-02-06 ENCOUNTER — Other Ambulatory Visit: Payer: Self-pay | Admitting: Medical

## 2018-03-24 ENCOUNTER — Encounter: Payer: Self-pay | Admitting: Gastroenterology

## 2018-03-24 ENCOUNTER — Ambulatory Visit: Payer: Managed Care, Other (non HMO) | Admitting: Gastroenterology

## 2018-03-24 VITALS — BP 126/88 | HR 72 | Ht 72.0 in | Wt 267.2 lb

## 2018-03-24 DIAGNOSIS — K640 First degree hemorrhoids: Secondary | ICD-10-CM | POA: Diagnosis not present

## 2018-03-24 DIAGNOSIS — K648 Other hemorrhoids: Secondary | ICD-10-CM

## 2018-03-24 NOTE — Patient Instructions (Signed)
If you are age 40 or older, your body mass index should be between 23-30. Your Body mass index is 36.25 kg/m. If this is out of the aforementioned range listed, please consider follow up with your Primary Care Provider.  If you are age 61 or younger, your body mass index should be between 19-25. Your Body mass index is 36.25 kg/m. If this is out of the aformentioned range listed, please consider follow up with your Primary Care Provider.   HEMORRHOID BANDING PROCEDURE    FOLLOW-UP CARE   1. The procedure you have had should have been relatively painless since the banding of the area involved does not have nerve endings and there is no pain sensation.  The rubber band cuts off the blood supply to the hemorrhoid and the band may fall off as soon as 48 hours after the banding (the band may occasionally be seen in the toilet bowl following a bowel movement). You may notice a temporary feeling of fullness in the rectum which should respond adequately to plain Tylenol or Motrin.  2. Following the banding, avoid strenuous exercise that evening and resume full activity the next day.  A sitz bath (soaking in a warm tub) or bidet is soothing, and can be useful for cleansing the area after bowel movements.     3. To avoid constipation, take two tablespoons of natural wheat bran, natural oat bran, flax, Benefiber or any over the counter fiber supplement and increase your water intake to 7-8 glasses daily.    4. Unless you have been prescribed anorectal medication, do not put anything inside your rectum for two weeks: No suppositories, enemas, fingers, etc.  5. Occasionally, you may have more bleeding than usual after the banding procedure.  This is often from the untreated hemorrhoids rather than the treated one.  Don't be concerned if there is a tablespoon or so of blood.  If there is more blood than this, lie flat with your bottom higher than your head and apply an ice pack to the area. If the bleeding  does not stop within a half an hour or if you feel faint, call our office at [phone] or go to the emergency room.  6. Problems are not common; however, if there is a substantial amount of bleeding, severe pain, chills, fever or difficulty passing urine (very rare) or other problems, you should call us at [phone] or report to the nearest emergency room.  7. Do not stay seated continuously for more than 2-3 hours for a day or two after the procedure.  Tighten your buttock muscles 10-15 times every two hours and take 10-15 deep breaths every 1-2 hours.  Do not spend more than a few minutes on the toilet if you cannot empty your bowel; instead re-visit the toilet at a later time.    Thank you for entrusting me with your care and for choosing [facility], Dr. Whiting Cellar

## 2018-03-24 NOTE — Progress Notes (Signed)
42 y/o male with history of rectal bleeding due to internal hemorrhoids, here for hemorrhoid banding therapy. Colonoscopy confirmed hemorrhoids and ruled out other causes. I discussed risks / benefits of banding and he wished to proceed.    PROCEDURE NOTE: The patient presents with symptomatic grade I  hemorrhoids, requesting rubber band ligation of his/her hemorrhoidal disease.  All risks, benefits and alternative forms of therapy were described and informed consent was obtained.  In the Left Lateral Decubitus position anoscopic examination revealed grade I hemorrhoids in all positions, largest in RP position.  The anorectum was pre-medicated with 0.125% nitroglycerin The decision was made to band the RP internal hemorrhoid, and the [place] was used to perform band ligation without complication.  Digital anorectal examination was then performed to assure proper positioning of the band, and to adjust the banded tissue as required.  The patient was discharged home without pain or other issues.  Dietary and behavioral recommendations were given and along with follow-up instructions.     The following adjunctive treatments were recommended: No straining, metamucil PRN  The patient will return in 2-4 weeks for follow-up and possible additional banding as required. No complications were encountered and the patient tolerated the procedure well.  Elberfeld Cellar, MD [facility]

## 2018-04-19 ENCOUNTER — Encounter: Payer: Managed Care, Other (non HMO) | Admitting: Gastroenterology

## 2018-05-06 ENCOUNTER — Encounter: Payer: Managed Care, Other (non HMO) | Admitting: Gastroenterology

## 2018-05-06 NOTE — Progress Notes (Signed)
No show letter sent.

## 2018-06-09 ENCOUNTER — Encounter: Payer: Managed Care, Other (non HMO) | Admitting: Gastroenterology

## 2018-06-13 ENCOUNTER — Encounter: Payer: Managed Care, Other (non HMO) | Admitting: Internal Medicine

## 2018-07-08 ENCOUNTER — Encounter: Payer: Managed Care, Other (non HMO) | Admitting: Gastroenterology

## 2018-07-12 ENCOUNTER — Encounter: Payer: Managed Care, Other (non HMO) | Admitting: Gastroenterology

## 2018-07-19 ENCOUNTER — Encounter: Payer: Managed Care, Other (non HMO) | Admitting: Gastroenterology

## 2018-08-01 ENCOUNTER — Encounter: Payer: Self-pay | Admitting: Medical

## 2018-08-09 ENCOUNTER — Other Ambulatory Visit: Payer: Self-pay | Admitting: Medical

## 2018-08-09 NOTE — Telephone Encounter (Signed)
Pt due for follow up please call and schedule appointment.  

## 2018-08-10 NOTE — Telephone Encounter (Signed)
Called pt and scheduled fu appt on Oct 11,2019 at 8:00. Done.

## 2018-08-11 ENCOUNTER — Ambulatory Visit: Payer: Managed Care, Other (non HMO) | Admitting: Medical

## 2018-08-12 ENCOUNTER — Ambulatory Visit: Payer: Managed Care, Other (non HMO) | Admitting: Medical

## 2018-08-15 ENCOUNTER — Ambulatory Visit: Payer: Managed Care, Other (non HMO) | Admitting: Medical

## 2018-08-15 DIAGNOSIS — Z0289 Encounter for other administrative examinations: Secondary | ICD-10-CM

## 2018-09-01 ENCOUNTER — Encounter: Payer: Self-pay | Admitting: Medical

## 2018-09-06 ENCOUNTER — Encounter: Payer: Self-pay | Admitting: Medical

## 2018-09-06 MED ORDER — LOSARTAN POTASSIUM 100 MG PO TABS: 100.0000 mg | ORAL_TABLET | Freq: Every day | ORAL | 0 refills | Status: DC

## 2018-10-07 ENCOUNTER — Other Ambulatory Visit: Payer: Self-pay | Admitting: Medical

## 2018-10-10 ENCOUNTER — Ambulatory Visit: Payer: Managed Care, Other (non HMO) | Admitting: Medical

## 2018-10-10 ENCOUNTER — Encounter: Payer: Self-pay | Admitting: Medical

## 2018-10-10 VITALS — BP 140/80 | HR 58 | Temp 98.1°F | Resp 16 | Ht 72.0 in | Wt 278.8 lb

## 2018-10-10 DIAGNOSIS — E785 Hyperlipidemia, unspecified: Secondary | ICD-10-CM | POA: Diagnosis not present

## 2018-10-10 DIAGNOSIS — F419 Anxiety disorder, unspecified: Secondary | ICD-10-CM | POA: Diagnosis not present

## 2018-10-10 DIAGNOSIS — Z23 Encounter for immunization: Secondary | ICD-10-CM

## 2018-10-10 DIAGNOSIS — I10 Essential (primary) hypertension: Secondary | ICD-10-CM | POA: Diagnosis not present

## 2018-10-10 NOTE — Patient Instructions (Signed)
Your blood pressure is moderately well controlled today.  Continue current medication.  Would recommend that you get blood pressure cuff and check your blood pressure occasionally to make sure that it is less than 140/90 consistently.  You do have some history of anxiety but overall your anxiety is controlled.  Benzodiazepine made you excessively drowsy.  I would monitor how you are doing with mood and anxiety.  If you have chronic issue then we could give you medication such as Effexor.  You declined referral to cardiologist presently.  If you want me to reactivate that referral just let me know by my chart and I will do so.  Did place fasting labs to check your metabolic panel and lipid panel.  Follow-up date to be determined after lab review.

## 2018-10-10 NOTE — Progress Notes (Signed)
Subjective:    Patient ID: Mark Dorsey, male    DOB: Jul 28, 1976, 42 y.o.   MRN: [id number]  HPI  Pt in for follow up.  He states he feels like he does not really need ativan. He states would take rarely but it really made him too drowsy. Pt does not want to be on this med. He states still has anxiety moments but not very frequent or severe. 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  Pt states anxiety is not daily. Very rare severe. Maybe 3 times a year at most.   Pt states he never went to cardiologist for palpitations. Pt states his palpitation went away and always occurred with anxiety. He tell me that he just can afford the work up at this point and he feels not necessary.  Pt has high cholesterol. He is not fasting.   Review of Systems  Constitutional: Negative for chills, fatigue and fever.  Respiratory: Negative for cough, chest tightness, shortness of breath and wheezing.   Cardiovascular: Negative for chest pain and palpitations.  Gastrointestinal: Negative for abdominal pain.  Musculoskeletal: Negative for back pain.  Skin: Negative for rash.  Neurological: Negative for dizziness, speech difficulty, weakness and headaches.  Hematological: Negative for adenopathy. Does not bruise/bleed easily.  Psychiatric/Behavioral: Negative for behavioral problems, confusion and sleep disturbance. The patient is nervous/anxious.        See hpi    Past Medical History:  Diagnosis Date  . Anxiety   . GERD (gastroesophageal reflux disease)   . Hypertension      Social History   Socioeconomic History  . Marital status: Married    Spouse name: Not on file  . Number of children: Not on file  . Years of education: Not on file  . Highest education level: Not on file  Occupational History  . Not on file  Social Needs  . Financial resource strain: Not on file  . Food insecurity:    Worry: Not on file    Inability: Not on file  . Transportation needs:   Medical: Not on file    Non-medical: Not on file  Tobacco Use  . Smoking status: Former Smoker    Types: Cigarettes    Last attempt to quit: 2007    Years since quitting: 12.9  . Smokeless tobacco: Never Used  Substance and Sexual Activity  . Alcohol use: Yes    Comment: 3 beers a week.  . Drug use: No  . Sexual activity: Yes  Lifestyle  . Physical activity:    Days per week: Not on file    Minutes per session: Not on file  . Stress: Not on file  Relationships  . Social connections:    Talks on phone: Not on file    Gets together: Not on file    Attends religious service: Not on file    Active member of club or organization: Not on file    Attends meetings of clubs or organizations: Not on file    Relationship status: Not on file  . Intimate partner violence:    Fear of current or ex partner: Not on file    Emotionally abused: Not on file    Physically abused: Not on file    Forced sexual activity: Not on file  Other Topics Concern  . Not on file  Social History Narrative  . Not on file    Past Surgical History:  Procedure Laterality Date  .  NO PAST SURGERIES      Family History  Problem Relation Age of Onset  . Nephrolithiasis Mother   . Diabetes Father   . Hypertension Father   . Hematuria Father   . Colon polyps Father   . Hypertension Sister   . Hypertension Paternal Grandmother   . Asthma Paternal Grandmother   . Hematuria Paternal Grandfather   . Colon polyps Paternal Grandfather     Allergies  Allergen Reactions  . Penicillins     Current Outpatient Medications on File Prior to Visit  Medication Sig Dispense Refill  . hydrocortisone-pramoxine (PROCTOFOAM HC) rectal foam Place 1 applicator rectally 2 (two) times daily. 10 g 1  . losartan (COZAAR) 100 MG tablet TAKE ONE TABLET BY MOUTH ONE TIME DAILY **MUST CALL DR. FOR APPOINTMENT** 30 tablet 0  . LORazepam (ATIVAN) 0.5 MG tablet Take 1 tablet (0.5 mg total) by mouth 2 (two) times daily as needed  for anxiety. (Patient not taking: Reported on 10/10/2018) 8 tablet 0   Current Facility-Administered Medications on File Prior to Visit  Medication Dose Route Frequency Provider Last Rate Last Dose  . 0.9 %  sodium chloride infusion  500 mL Intravenous Once Armbruster, Carlota Raspberry, MD        BP (!) 147/93   Pulse (!) 58   Temp 98.1 F (36.7 C) (Oral)   Resp 16   Ht 6' (1.829 m)   Wt 278 lb 12.8 oz (126.5 kg)   SpO2 100%   BMI 37.81 kg/m       Objective:   Physical Exam  General Mental Status- Alert. General Appearance- Not in acute distress.   Skin General: Color- Normal Color. Moisture- Normal Moisture.  Neck Carotid Arteries- Normal color. Moisture- Normal Moisture. No carotid bruits. No JVD.  Chest and Lung Exam Auscultation: Breath Sounds:-Normal.  Cardiovascular Auscultation:Rythm- Regular. Murmurs & Other Heart Sounds:Auscultation of the heart reveals- No Murmurs.  Abdomen Inspection:-Inspeection Normal. Palpation/Percussion:Note:No mass. Palpation and Percussion of the abdomen reveal- Non Tender, Non Distended + BS, no rebound or guarding.  Neurologic Cranial Nerve exam:- CN III-XII intact(No nystagmus), symmetric smile. Strength:- 5/5 equal and symmetric strength both upper and lower extremities.      Assessment & Plan:  Your blood pressure is moderately well controlled today.  Continue current medication.  Would recommend that you get blood pressure cuff and check your blood pressure occasionally to make sure that it is less than 140/90 consistently.  You do have some history of anxiety but overall your anxiety is controlled.  Benzodiazepine made you excessively drowsy.  I would monitor how you are doing with mood and anxiety.  If you have chronic issue then we could give you medication such as Effexor.  You declined referral to cardiologist presently.  If you want me to reactivate that referral just let me know by my chart and I will do so.  Did place  fasting labs to check your metabolic panel and lipid panel.  Follow-up date to be determined after lab review.  Mackie Pai, PA-C

## 2018-10-11 ENCOUNTER — Other Ambulatory Visit (INDEPENDENT_AMBULATORY_CARE_PROVIDER_SITE_OTHER): Payer: Managed Care, Other (non HMO)

## 2018-10-11 DIAGNOSIS — I10 Essential (primary) hypertension: Secondary | ICD-10-CM

## 2018-10-11 LAB — COMPREHENSIVE METABOLIC PANEL
ALT: 18 U/L (ref 0–53)
AST: 15 U/L (ref 0–37)
Albumin: 4.2 g/dL (ref 3.5–5.2)
Alkaline Phosphatase: 54 U/L (ref 39–117)
BUN: 13 mg/dL (ref 6–23)
CO2: 30 mEq/L (ref 19–32)
Calcium: 9 mg/dL (ref 8.4–10.5)
Chloride: 103 mEq/L (ref 96–112)
Creatinine, Ser: 0.94 mg/dL (ref 0.40–1.50)
GFR: 94.25 mL/min (ref >60.00)
Glucose, Bld: 103 mg/dL — ABNORMAL HIGH (ref 70–99)
Potassium: 4.2 mEq/L (ref 3.5–5.1)
Sodium: 138 mEq/L (ref 135–145)
Total Bilirubin: 0.5 mg/dL (ref 0.2–1.2)
Total Protein: 6.6 g/dL (ref 6.0–8.3)

## 2018-10-11 LAB — LIPID PANEL
CHOLESTEROL: 173 mg/dL (ref 0–200)
HDL: 30.2 mg/dL — ABNORMAL LOW (ref >39.00)
LDL CALC: 103 mg/dL — AB (ref 0–99)
NonHDL: 142.59
Total CHOL/HDL Ratio: 6
Triglycerides: 196 mg/dL — ABNORMAL HIGH (ref 0.0–149.0)
VLDL: 39.2 mg/dL (ref 0.0–40.0)

## 2018-11-05 ENCOUNTER — Other Ambulatory Visit: Payer: Self-pay | Admitting: Medical

## 2018-12-06 ENCOUNTER — Telehealth: Payer: Self-pay | Admitting: Medical

## 2018-12-06 ENCOUNTER — Other Ambulatory Visit: Payer: Self-pay | Admitting: Medical

## 2018-12-06 ENCOUNTER — Encounter: Payer: Self-pay | Admitting: Medical

## 2018-12-06 DIAGNOSIS — M25571 Pain in right ankle and joints of right foot: Secondary | ICD-10-CM

## 2018-12-06 DIAGNOSIS — M7661 Achilles tendinitis, right leg: Secondary | ICD-10-CM

## 2018-12-06 MED ORDER — LOSARTAN POTASSIUM 100 MG PO TABS: ORAL_TABLET | ORAL | 5 refills | Status: DC

## 2018-12-06 MED ORDER — LOSARTAN POTASSIUM 100 MG PO TABS: ORAL_TABLET | ORAL | 1 refills | Status: DC

## 2018-12-06 NOTE — Telephone Encounter (Signed)
I accidentally placed referral to sports medicine. Got into wrong chart. I was trying to cancel that referral but don't know how to cancel the referal.

## 2018-12-06 NOTE — Telephone Encounter (Signed)
Rx of losartan sent to pt phramacy.

## 2018-12-06 NOTE — Telephone Encounter (Signed)
Opened to refill losartan

## 2018-12-06 NOTE — Telephone Encounter (Signed)
Rx losartan 90 tabs. Not to pharmacy not to fill 30 tab rx.

## 2018-12-19 ENCOUNTER — Encounter: Payer: Self-pay | Admitting: Medical

## 2018-12-19 ENCOUNTER — Ambulatory Visit (HOSPITAL_BASED_OUTPATIENT_CLINIC_OR_DEPARTMENT_OTHER)
Admission: RE | Admit: 2018-12-19 | Discharge: 2018-12-19 | Disposition: A | Discharge status: Home / Self Care | Payer: Managed Care, Other (non HMO) | Source: Ambulatory Visit | Attending: Medical | Admitting: Medical

## 2018-12-19 ENCOUNTER — Ambulatory Visit: Payer: Managed Care, Other (non HMO) | Admitting: Medical

## 2018-12-19 VITALS — BP 121/83 | HR 80 | Temp 98.5°F | Resp 18 | Wt 278.0 lb

## 2018-12-19 DIAGNOSIS — I10 Essential (primary) hypertension: Secondary | ICD-10-CM

## 2018-12-19 DIAGNOSIS — M5441 Lumbago with sciatica, right side: Secondary | ICD-10-CM | POA: Insufficient documentation

## 2018-12-19 MED ORDER — CYCLOBENZAPRINE HCL 10 MG PO TABS: 10.0000 mg | ORAL_TABLET | Freq: Every day | ORAL | 0 refills | Status: DC

## 2018-12-19 NOTE — Patient Instructions (Addendum)
You do appear to have some sciatica type pain over the last month in the right SI region.  Then recently this weekend might have strained left side paralumbar muscle.  For the 30day lumbar region of pain will go ahead and get x-ray lumbar spine today.   For both acute pain this weekend and pain over the past month, I did prescribe Flexeril muscle relaxant to use at night.  Also recommend using low-dose ibuprofen 200 to 400 mg every 8 hours as needed.  Rx regarding ibuprofen effect on blood pressure.  Back exercises as tolerated once your pain decreases.  Did provide a work note explaining that having a standing desk to intermittently change positions would be healthy and beneficial to reduce her back pain.  Follow-up in 7 to 10 days if back pain persisting.  Or as needed.  Do recommend routine well exam/CPE for the next 2 to 3 months.    Back Exercises If you have pain in your back, do these exercises 2-3 times each day or as told by your doctor. When the pain goes away, do the exercises once each day, but repeat the steps more times for each exercise (do more repetitions). If you do not have pain in your back, do these exercises once each day or as told by your doctor. Exercises Single Knee to Chest Do these steps 3-5 times in a row for each leg: 1. Lie on your back on a firm bed or the floor with your legs stretched out. 2. Bring one knee to your chest. 3. Hold your knee to your chest by grabbing your knee or thigh. 4. Pull on your knee until you feel a gentle stretch in your lower back. 5. Keep doing the stretch for 10-30 seconds. 6. Slowly let go of your leg and straighten it. Pelvic Tilt Do these steps 5-10 times in a row: 1. Lie on your back on a firm bed or the floor with your legs stretched out. 2. Bend your knees so they point up to the ceiling. Your feet should be flat on the floor. 3. Tighten your lower belly (abdomen) muscles to press your lower back against the floor. This  will make your tailbone point up to the ceiling instead of pointing down to your feet or the floor. 4. Stay in this position for 5-10 seconds while you gently tighten your muscles and breathe evenly. Cat-Cow Do these steps until your lower back bends more easily: 1. Get on your hands and knees on a firm surface. Keep your hands under your shoulders, and keep your knees under your hips. You may put padding under your knees. 2. Let your head hang down, and make your tailbone point down to the floor so your lower back is round like the back of a cat. 3. Stay in this position for 5 seconds. 4. Slowly lift your head and make your tailbone point up to the ceiling so your back hangs low (sags) like the back of a cow. 5. Stay in this position for 5 seconds.  Press-Ups Do these steps 5-10 times in a row: 1. Lie on your belly (face-down) on the floor. 2. Place your hands near your head, about shoulder-width apart. 3. While you keep your back relaxed and keep your hips on the floor, slowly straighten your arms to raise the top half of your body and lift your shoulders. Do not use your back muscles. To make yourself more comfortable, you may change where you place your hands.  4. Stay in this position for 5 seconds. 5. Slowly return to lying flat on the floor.  Bridges Do these steps 10 times in a row: 1. Lie on your back on a firm surface. 2. Bend your knees so they point up to the ceiling. Your feet should be flat on the floor. 3. Tighten your butt muscles and lift your butt off of the floor until your waist is almost as high as your knees. If you do not feel the muscles working in your butt and the back of your thighs, slide your feet 1-2 inches farther away from your butt. 4. Stay in this position for 3-5 seconds. 5. Slowly lower your butt to the floor, and let your butt muscles relax. If this exercise is too easy, try doing it with your arms crossed over your chest. Belly Crunches Do these steps  5-10 times in a row: 1. Lie on your back on a firm bed or the floor with your legs stretched out. 2. Bend your knees so they point up to the ceiling. Your feet should be flat on the floor. 3. Cross your arms over your chest. 4. Tip your chin a little bit toward your chest but do not bend your neck. 5. Tighten your belly muscles and slowly raise your chest just enough to lift your shoulder blades a tiny bit off of the floor. 6. Slowly lower your chest and your head to the floor. Back Lifts Do these steps 5-10 times in a row: 1. Lie on your belly (face-down) with your arms at your sides, and rest your forehead on the floor. 2. Tighten the muscles in your legs and your butt. 3. Slowly lift your chest off of the floor while you keep your hips on the floor. Keep the back of your head in line with the curve in your back. Look at the floor while you do this. 4. Stay in this position for 3-5 seconds. 5. Slowly lower your chest and your face to the floor. Contact a doctor if:  Your back pain gets a lot worse when you do an exercise.  Your back pain does not lessen 2 hours after you exercise. If you have any of these problems, stop doing the exercises. Do not do them again unless your doctor says it is okay. Get help right away if:  You have sudden, very bad back pain. If this happens, stop doing the exercises. Do not do them again unless your doctor says it is okay. This information is not intended to replace advice given to you by your health care provider. Make sure you discuss any questions you have with your health care provider. Document Released: 11/21/2010 Document Revised: 07/13/2018 Document Reviewed: 12/13/2014 Elsevier Interactive Patient Education  [employer].

## 2018-12-19 NOTE — Progress Notes (Signed)
Subjective:    Patient ID: Mark Dorsey, male    DOB: 01/12/1976, 43 y.o.   MRN: [id number]  HPI  Pt in for back pain.  He states had some relations with wife and next day felt some pain in his lower back. Pain first noticed on Saturday morning. But also he reports that he has he back pain that radiates to his rt buttox area for about one month.  No hx of any chronic daily back pain.  Around dec 2018 self limited that last only 3-4 days.  Not taking any med for pain.   Review of Systems  Constitutional: Negative for chills, fatigue and fever.  Respiratory: Negative for cough, chest tightness, shortness of breath and wheezing.   Cardiovascular: Negative for chest pain and palpitations.  Gastrointestinal: Negative for abdominal pain.  Musculoskeletal: Positive for back pain.       See hpi.  Skin: Negative for rash.  Hematological: Negative for adenopathy. Does not bruise/bleed easily.  Psychiatric/Behavioral: Negative for behavioral problems and confusion.    Past Medical History:  Diagnosis Date  . Anxiety   . GERD (gastroesophageal reflux disease)   . Hypertension      Social History   Socioeconomic History  . Marital status: Married    Spouse name: Not on file  . Number of children: Not on file  . Years of education: Not on file  . Highest education level: Not on file  Occupational History  . Not on file  Social Needs  . Financial resource strain: Not on file  . Food insecurity:    Worry: Not on file    Inability: Not on file  . Transportation needs:    Medical: Not on file    Non-medical: Not on file  Tobacco Use  . Smoking status: Former Smoker    Types: Cigarettes    Last attempt to quit: 2007    Years since quitting: 13.1  . Smokeless tobacco: Never Used  Substance and Sexual Activity  . Alcohol use: Yes    Comment: 3 beers a week.  . Drug use: No  . Sexual activity: Yes  Lifestyle  . Physical activity:    Days per week: Not on file   Minutes per session: Not on file  . Stress: Not on file  Relationships  . Social connections:    Talks on phone: Not on file    Gets together: Not on file    Attends religious service: Not on file    Active member of club or organization: Not on file    Attends meetings of clubs or organizations: Not on file    Relationship status: Not on file  . Intimate partner violence:    Fear of current or ex partner: Not on file    Emotionally abused: Not on file    Physically abused: Not on file    Forced sexual activity: Not on file  Other Topics Concern  . Not on file  Social History Narrative  . Not on file    Past Surgical History:  Procedure Laterality Date  . NO PAST SURGERIES      Family History  Problem Relation Age of Onset  . Nephrolithiasis Mother   . Diabetes Father   . Hypertension Father   . Hematuria Father   . Colon polyps Father   . Hypertension Sister   . Hypertension Paternal Grandmother   . Asthma Paternal Grandmother   . Hematuria Paternal Grandfather   . Colon polyps  Paternal Grandfather     Allergies  Allergen Reactions  . Penicillins     Current Outpatient Medications on File Prior to Visit  Medication Sig Dispense Refill  . hydrocortisone-pramoxine (PROCTOFOAM HC) rectal foam Place 1 applicator rectally 2 (two) times daily. 10 g 1  . LORazepam (ATIVAN) 0.5 MG tablet Take 1 tablet (0.5 mg total) by mouth 2 (two) times daily as needed for anxiety. (Patient not taking: Reported on 10/10/2018) 8 tablet 0  . losartan (COZAAR) 100 MG tablet 1 tab po q day 90 tablet 1   Current Facility-Administered Medications on File Prior to Visit  Medication Dose Route Frequency Provider Last Rate Last Dose  . 0.9 %  sodium chloride infusion  500 mL Intravenous Once Armbruster, Carlota Raspberry, MD        BP 121/83 (BP Location: Left Arm, Patient Position: Sitting, Cuff Size: Normal)   Pulse 80   Temp 98.5 F (36.9 C) (Oral)   Resp 18   Wt 278 lb (126.1 kg)   SpO2 100%    BMI 37.70 kg/m       Objective:   Physical Exam  General Appearance- Not in acute distress.   Chest and Lung Exam Auscultation: Breath sounds:-Normal. Clear even and unlabored. Adventitious sounds:- No Adventitious sounds.  Cardiovascular Auscultation:Rythm - Regular, rate and rythm. Heart Sounds -Normal heart sounds.  Abdomen Inspection:-Inspection Normal.  Palpation/Perucssion: Palpation and Percussion of the abdomen reveal- Non Tender, No Rebound tenderness, No rigidity(Guarding) and No Palpable abdominal masses.  Liver:-Normal.  Spleen:- Normal.   Back Mid lumbar spine tenderness to palpation. Rt si tenderness to palpation. Pain on straight leg lift. Pain on lateral movements and flexion/extension of the spine.  Lower ext neurologic  L5-S1 sensation intact bilaterally. Normal patellar reflexes bilaterally. No foot drop bilaterally.      Assessment & Plan:  You do appear to have some sciatica type pain over the last month in the right SI region.  Then recently this weekend might have strained left side paralumbar muscle.  For the 30day lumbar region of pain will go ahead and get x-ray lumbar spine today.   For both acute pain this weekend and pain over the past month, I did prescribe Flexeril muscle relaxant to use at night.  Also recommend using low-dose ibuprofen 200 to 400 mg every 8 hours as needed.  Rx regarding ibuprofen effect on blood pressure.  Back exercises as tolerated once your pain decreases.  Did provide a work note explaining that having a standing desk to intermittently change positions would be healthy and beneficial to reduce her back pain.  Follow-up in 7 to 10 days if back pain persisting.  Or as needed.  Do recommend routine well exam/CPE for the next 2 to 3 months.  Mackie Pai, PA-C

## 2019-01-20 ENCOUNTER — Telehealth: Payer: Self-pay | Admitting: Medical

## 2019-01-20 ENCOUNTER — Encounter: Payer: Self-pay | Admitting: Medical

## 2019-01-20 MED ORDER — LORAZEPAM 0.5 MG PO TABS: ORAL_TABLET | ORAL | 0 refills | Status: DC

## 2019-01-20 NOTE — Telephone Encounter (Signed)
Rx ativan sent to pt pharmacy. For severe anxiety.

## 2019-03-14 ENCOUNTER — Encounter: Payer: Self-pay | Admitting: Medical

## 2019-04-23 ENCOUNTER — Encounter: Payer: Self-pay | Admitting: Medical

## 2019-06-01 ENCOUNTER — Other Ambulatory Visit: Payer: Self-pay | Admitting: Medical

## 2019-06-05 ENCOUNTER — Encounter: Payer: Self-pay | Admitting: Family Medicine

## 2019-06-05 ENCOUNTER — Ambulatory Visit: Payer: Managed Care, Other (non HMO) | Admitting: Family Medicine

## 2019-06-05 ENCOUNTER — Other Ambulatory Visit: Payer: Self-pay

## 2019-06-05 VITALS — BP 130/82 | HR 92 | Temp 98.1°F | Ht 73.0 in | Wt 270.0 lb

## 2019-06-05 DIAGNOSIS — M25511 Pain in right shoulder: Secondary | ICD-10-CM | POA: Diagnosis not present

## 2019-06-05 MED ORDER — PREDNISONE 20 MG PO TABS: 40.0000 mg | ORAL_TABLET | Freq: Every day | ORAL | 0 refills | Status: AC

## 2019-06-05 NOTE — Progress Notes (Signed)
Musculoskeletal Exam  Patient: Mark Dorsey DOB: 1976/02/03  DOS: 06/05/2019  SUBJECTIVE:  Chief Complaint:   Chief Complaint  Patient presents with  . Shoulder Pain    right     Mark Dorsey is a 43 y.o.  male for evaluation and treatment of R shoulder pain.   Onset:  1 day ago. Had hands behind head, pain started when he moved them back to neutral. No motion out of ordinary for him.  Location: Top of R shoulder Character:  sharp  Progression of issue:  is unchanged Associated symptoms: Pain w reaching back; no bruising or swelling Treatment: to date has been OTC NSAIDS, Voltaren gel, Tylenol.   Neurovascular symptoms: no  ROS: Musculoskeletal/Extremities: +R shoulder pain  Past Medical History:  Diagnosis Date  . Anxiety   . GERD (gastroesophageal reflux disease)   . Hypertension     Objective: VITAL SIGNS: BP 130/82 (BP Location: Left Arm, Patient Position: Sitting, Cuff Size: Large)   Pulse 92   Temp 98.1 F (36.7 C) (Oral)   Ht 6\' 1"  (1.854 m)   Wt 270 lb (122.5 kg)   SpO2 97%   BMI 35.62 kg/m  Constitutional: Well formed, well developed. No acute distress. Cardiovascular: Brisk cap refill Thorax & Lungs: No accessory muscle use Musculoskeletal: R shoulder.   Normal active range of motion: yes.   Normal passive range of motion: yes Tenderness to palpation: mild ttp over the coracoid process Deformity: no Ecchymosis: no Tests positive: Speed's, Hawkins, empty can  Tests negative: Neer's, cross over, lift off, Yergason's Neurologic: Normal sensory function. No focal deficits noted. DTR's equal and symmetric in UE's. No clonus. Psychiatric: Normal mood. Age appropriate judgment and insight. Alert & oriented x 3.    Assessment:  Acute pain of right shoulder - Plan: predniSONE (DELTASONE) 20 MG tablet, heat, ice, stretches/exercises, Tylenol. If better but worsens again, will consider injection. XR and maybe PT if no better at all.   Plan: Orders  as above. F/u in 1 week if no better. The patient voiced understanding and agreement to the plan.   [place], DO 06/05/19  1:48 PM

## 2019-06-05 NOTE — Patient Instructions (Addendum)
Ice/cold pack over area for 10-15 min twice daily.  Heat (pad or rice pillow in microwave) over affected area, 10-15 minutes twice daily.   OK to take Tylenol 1000 mg (2 extra strength tabs) or 975 mg (3 regular strength tabs) every 6 hours as needed.  Schedule appt next Monday if no better.   EXERCISES  RANGE OF MOTION (ROM) AND STRETCHING EXERCISES These exercises may help you when beginning to rehabilitate your injury. While completing these exercises, remember:   Restoring tissue flexibility helps normal motion to return to the joints. This allows healthier, less painful movement and activity.  An effective stretch should be held for at least 30 seconds.  A stretch should never be painful. You should only feel a gentle lengthening or release in the stretched tissue.  ROM - Pendulum  Bend at the waist so that your right / left arm falls away from your body. Support yourself with your opposite hand on a solid surface, such as a table or a countertop.  Your right / left arm should be perpendicular to the ground. If it is not perpendicular, you need to lean over farther. Relax the muscles in your right / left arm and shoulder as much as possible.  Gently sway your hips and trunk so they move your right / left arm without any use of your right / left shoulder muscles.  Progress your movements so that your right / left arm moves side to side, then forward and backward, and finally, both clockwise and counterclockwise.  Complete 10-15 repetitions in each direction. Many people use this exercise to relieve discomfort in their shoulder as well as to gain range of motion. Repeat 2 times. Complete this exercise 3 times per week.  STRETCH - Flexion, Standing  Stand with good posture. With an underhand grip on your right / left hand and an overhand grip on the opposite hand, grasp a broomstick or cane so that your hands are a little more than shoulder-width apart.  Keeping your right / left  elbow straight and shoulder muscles relaxed, push the stick with your opposite hand to raise your right / left arm in front of your body and then overhead. Raise your arm until you feel a stretch in your right / left shoulder, but before you have increased shoulder pain.  Try to avoid shrugging your right / left shoulder as your arm rises by keeping your shoulder blade tucked down and toward your mid-back spine. Hold 30 seconds.  Slowly return to the starting position. Repeat 2 times. Complete this exercise 3 times per week.  STRETCH - Internal Rotation  Place your right / left hand behind your back, palm-up.  Throw a towel or belt over your opposite shoulder. Grasp the towel/belt with your right / left hand.  While keeping an upright posture, gently pull up on the towel/belt until you feel a stretch in the front of your right / left shoulder.  Avoid shrugging your right / left shoulder as your arm rises by keeping your shoulder blade tucked down and toward your mid-back spine.  Hold 30. Release the stretch by lowering your opposite hand. Repeat 2 times. Complete this exercise 3 times per week.  STRETCH - External Rotation and Abduction  Stagger your stance through a doorframe. It does not matter which foot is forward.  As instructed by your physician, physical therapist or athletic trainer, place your hands: ? And forearms above your head and on the door frame. ? And forearms  at head-height and on the door frame. ? At elbow-height and on the door frame.  Keeping your head and chest upright and your stomach muscles tight to prevent over-extending your low-back, slowly shift your weight onto your front foot until you feel a stretch across your chest and/or in the front of your shoulders.  Hold 30 seconds. Shift your weight to your back foot to release the stretch. Repeat 2 times. Complete this stretch 3 times per week.   STRENGTHENING EXERCISES  These exercises may help you when  beginning to rehabilitate your injury. They may resolve your symptoms with or without further involvement from your physician, physical therapist or athletic trainer. While completing these exercises, remember:   Muscles can gain both the endurance and the strength needed for everyday activities through controlled exercises.  Complete these exercises as instructed by your physician, physical therapist or athletic trainer. Progress the resistance and repetitions only as guided.  You may experience muscle soreness or fatigue, but the pain or discomfort you are trying to eliminate should never worsen during these exercises. If this pain does worsen, stop and make certain you are following the directions exactly. If the pain is still present after adjustments, discontinue the exercise until you can discuss the trouble with your clinician.  If advised by your physician, during your recovery, avoid activity or exercises which involve actions that place your right / left hand or elbow above your head or behind your back or head. These positions stress the tissues which are trying to heal.  STRENGTH - Scapular Depression and Adduction  With good posture, sit on a firm chair. Supported your arms in front of you with pillows, arm rests or a table top. Have your elbows in line with the sides of your body.  Gently draw your shoulder blades down and toward your mid-back spine. Gradually increase the tension without tensing the muscles along the top of your shoulders and the back of your neck.  Hold for 3 seconds. Slowly release the tension and relax your muscles completely before completing the next repetition.  After you have practiced this exercise, remove the arm support and complete it in standing as well as sitting. Repeat 2 times. Complete this exercise 3 times per week.   STRENGTH - External Rotators  Secure a rubber exercise band/tubing to a fixed object so that it is at the same height as your  right / left elbow when you are standing or sitting on a firm surface.  Stand or sit so that the secured exercise band/tubing is at your side that is not injured.  Bend your elbow 90 degrees. Place a folded towel or small pillow under your right / left arm so that your elbow is a few inches away from your side.  Keeping the tension on the exercise band/tubing, pull it away from your body, as if pivoting on your elbow. Be sure to keep your body steady so that the movement is only coming from your shoulder rotating.  Hold 3 seconds. Release the tension in a controlled manner as you return to the starting position. Repeat 2 times. Complete this exercise 3 times per week.   STRENGTH - Supraspinatus  Stand or sit with good posture. Grasp a 2-3 lb weight or an exercise band/tubing so that your hand is "thumbs-up," like when you shake hands.  Slowly lift your right / left hand from your thigh into the air, traveling about 30 degrees from straight out at your side. Lift your  hand to shoulder height or as far as you can without increasing any shoulder pain. Initially, many people do not lift their hands above shoulder height.  Avoid shrugging your right / left shoulder as your arm rises by keeping your shoulder blade tucked down and toward your mid-back spine.  Hold for 3 seconds. Control the descent of your hand as you slowly return to your starting position. Repeat 2 times. Complete this exercise 3 times per week.   STRENGTH - Shoulder Extensors  Secure a rubber exercise band/tubing so that it is at the height of your shoulders when you are either standing or sitting on a firm arm-less chair.  With a thumbs-up grip, grasp an end of the band/tubing in each hand. Straighten your elbows and lift your hands straight in front of you at shoulder height. Step back away from the secured end of band/tubing until it becomes tense.  Squeezing your shoulder blades together, pull your hands down to the sides  of your thighs. Do not allow your hands to go behind you.  Hold for 3 seconds. Slowly ease the tension on the band/tubing as you reverse the directions and return to the starting position. Repeat 2 times. Complete this exercise 3 times per week.   STRENGTH - Scapular Retractors  Secure a rubber exercise band/tubing so that it is at the height of your shoulders when you are either standing or sitting on a firm arm-less chair.  With a palm-down grip, grasp an end of the band/tubing in each hand. Straighten your elbows and lift your hands straight in front of you at shoulder height. Step back away from the secured end of band/tubing until it becomes tense.  Squeezing your shoulder blades together, draw your elbows back as you bend them. Keep your upper arm lifted away from your body throughout the exercise.  Hold 3 seconds. Slowly ease the tension on the band/tubing as you reverse the directions and return to the starting position. Repeat 2 times. Complete this exercise 3 times per week.  STRENGTH - Scapular Depressors  Find a sturdy chair without wheels, such as a from a dining room table.  Keeping your feet on the floor, lift your bottom from the seat and lock your elbows.  Keeping your elbows straight, allow gravity to pull your body weight down. Your shoulders will rise toward your ears.  Raise your body against gravity by drawing your shoulder blades down your back, shortening the distance between your shoulders and ears. Although your feet should always maintain contact with the floor, your feet should progressively support less body weight as you get stronger.  Hold 3 seconds. In a controlled and slow manner, lower your body weight to begin the next repetition. Repeat 2 times. Complete this exercise 3 times per week.   This information is not intended to replace advice given to you by your health care provider. Make sure you discuss any questions you have with your health care  provider.  Document Released: 09/02/2005 Document Revised: 11/09/2014 Document Reviewed: 01/31/2009 Elsevier Interactive Patient Education [employer].

## 2019-09-11 ENCOUNTER — Encounter: Payer: Self-pay | Admitting: Medical

## 2019-09-11 ENCOUNTER — Telehealth: Payer: Self-pay | Admitting: Medical

## 2019-09-11 MED ORDER — HYDROCORT-PRAMOXINE (PERIANAL) 1-1 % EX FOAM: 1.0000 | Freq: Two times a day (BID) | CUTANEOUS | 2 refills | Status: DC

## 2019-09-11 NOTE — Telephone Encounter (Signed)
Rx refill of proctofoam sent to pt pharmacy.

## 2019-12-02 ENCOUNTER — Other Ambulatory Visit: Payer: Self-pay | Admitting: Medical

## 2019-12-22 ENCOUNTER — Other Ambulatory Visit: Payer: Self-pay

## 2019-12-22 ENCOUNTER — Ambulatory Visit: Payer: Managed Care, Other (non HMO) | Admitting: Medical

## 2019-12-22 DIAGNOSIS — Z0289 Encounter for other administrative examinations: Secondary | ICD-10-CM

## 2019-12-22 NOTE — Progress Notes (Deleted)
   Subjective:    Patient ID: Mark Dorsey, male    DOB: 11-20-1975, 44 y.o.   MRN: [id number]  HPI    Review of Systems     Objective:   Physical Exam        Assessment & Plan:

## 2019-12-27 ENCOUNTER — Other Ambulatory Visit: Payer: Self-pay

## 2019-12-28 ENCOUNTER — Ambulatory Visit: Payer: Managed Care, Other (non HMO) | Admitting: Medical

## 2019-12-28 ENCOUNTER — Encounter: Payer: Self-pay | Admitting: Medical

## 2019-12-28 VITALS — BP 138/80 | HR 72 | Resp 18 | Ht 72.0 in | Wt 289.4 lb

## 2019-12-28 DIAGNOSIS — I10 Essential (primary) hypertension: Secondary | ICD-10-CM

## 2019-12-28 DIAGNOSIS — F419 Anxiety disorder, unspecified: Secondary | ICD-10-CM | POA: Diagnosis not present

## 2019-12-28 MED ORDER — VENLAFAXINE HCL ER 37.5 MG PO CP24: 37.5000 mg | ORAL_CAPSULE | Freq: Every day | ORAL | 0 refills | Status: DC

## 2019-12-28 MED ORDER — SERTRALINE HCL 25 MG PO TABS: 25.0000 mg | ORAL_TABLET | Freq: Every day | ORAL | 0 refills | Status: DC

## 2019-12-28 MED ORDER — BUSPIRONE HCL 7.5 MG PO TABS: 7.5000 mg | ORAL_TABLET | Freq: Two times a day (BID) | ORAL | 0 refills | Status: DC

## 2019-12-28 NOTE — Addendum Note (Signed)
Addended by: Anabel Halon on: 12/28/2019 08:35 AM   Modules accepted: Orders

## 2019-12-28 NOTE — Patient Instructions (Addendum)
Your bp is decently controlled but little high likely to severe anxiety earlier. Continue current bp medication.  For anxiety, I think combination of effexor and buspar will be helpful. Can start buspar 1st and then in a week add the sertraline. I gave you counselor info sheet.   Follow up 2 weeks or as needed

## 2019-12-28 NOTE — Progress Notes (Signed)
Subjective:    Patient ID: Mark Dorsey, male    DOB: 1976-07-16, 44 y.o.   MRN: [id number]  HPI  Pt in for follow up.  Pt htn is borderline elevated in systolic. But he does admit his anxiety has been worse recenltly. So bp is likely increased due to that.  Pt states he is very stressed and anxious due to work stress. 10% of company got layed off. He is thinking of maybe carreer change.  Pt is taking ativan but very sparingly. He does not like to take    Review of Systems  Constitutional: Negative for chills and fever.  Respiratory: Negative for chest tightness, shortness of breath and wheezing.   Cardiovascular: Negative for chest pain and palpitations.  Gastrointestinal: Negative for abdominal pain.  Musculoskeletal: Negative for back pain.  Skin: Negative for rash.  Neurological: Negative for dizziness and speech difficulty.  Hematological: Negative for adenopathy. Does not bruise/bleed easily.  Psychiatric/Behavioral: Negative for behavioral problems, dysphoric mood and suicidal ideas. The patient is nervous/anxious.     Past Medical History:  Diagnosis Date  . Anxiety   . GERD (gastroesophageal reflux disease)   . Hypertension      Social History   Socioeconomic History  . Marital status: Married    Spouse name: Not on file  . Number of children: Not on file  . Years of education: Not on file  . Highest education level: Not on file  Occupational History  . Not on file  Tobacco Use  . Smoking status: Former Smoker    Types: Cigarettes    Quit date: 2007    Years since quitting: 14.1  . Smokeless tobacco: Never Used  Substance and Sexual Activity  . Alcohol use: Yes    Comment: 3 beers a week.  . Drug use: No  . Sexual activity: Yes  Other Topics Concern  . Not on file  Social History Narrative  . Not on file   Social Determinants of Health   Financial Resource Strain:   . Difficulty of Paying Living Expenses: Not on file  Food Insecurity:     . Worried About Charity fundraiser in the Last Year: Not on file  . Ran Out of Food in the Last Year: Not on file  Transportation Needs:   . Lack of Transportation (Medical): Not on file  . Lack of Transportation (Non-Medical): Not on file  Physical Activity:   . Days of Exercise per Week: Not on file  . Minutes of Exercise per Session: Not on file  Stress:   . Feeling of Stress : Not on file  Social Connections:   . Frequency of Communication with Friends and Family: Not on file  . Frequency of Social Gatherings with Friends and Family: Not on file  . Attends Religious Services: Not on file  . Active Member of Clubs or Organizations: Not on file  . Attends Archivist Meetings: Not on file  . Marital Status: Not on file  Intimate Partner Violence:   . Fear of Current or Ex-Partner: Not on file  . Emotionally Abused: Not on file  . Physically Abused: Not on file  . Sexually Abused: Not on file    Past Surgical History:  Procedure Laterality Date  . NO PAST SURGERIES      Family History  Problem Relation Age of Onset  . Nephrolithiasis Mother   . Diabetes Father   . Hypertension Father   . Hematuria Father   .  Colon polyps Father   . Hypertension Sister   . Hypertension Paternal Grandmother   . Asthma Paternal Grandmother   . Hematuria Paternal Grandfather   . Colon polyps Paternal Grandfather     Allergies  Allergen Reactions  . Penicillins     Current Outpatient Medications on File Prior to Visit  Medication Sig Dispense Refill  . LORazepam (ATIVAN) 1 MG tablet Take by mouth.    . cyclobenzaprine (FLEXERIL) 10 MG tablet Take 1 tablet (10 mg total) by mouth at bedtime. 7 tablet 0  . hydrocortisone-pramoxine (PROCTOFOAM HC) rectal foam Place 1 applicator rectally 2 (two) times daily. 10 g 1  . hydrocortisone-pramoxine (PROCTOFOAM-HC) rectal foam Place 1 applicator rectally 2 (two) times daily. 10 g 2  . LORazepam (ATIVAN) 0.5 MG tablet Take 1 tablet  (0.5 mg total) by mouth 2 (two) times daily as needed for anxiety. 8 tablet 0  . LORazepam (ATIVAN) 0.5 MG tablet 1 tab po bid prn anxiety 30 tablet 0  . losartan (COZAAR) 100 MG tablet TAKE ONE TABLET BY MOUTH ONE TIME DAILY 30 tablet 0   Current Facility-Administered Medications on File Prior to Visit  Medication Dose Route Frequency Provider Last Rate Last Admin  . 0.9 %  sodium chloride infusion  500 mL Intravenous Once Armbruster, Carlota Raspberry, MD        BP 118/90 (BP Location: Left Arm, Patient Position: Sitting, Cuff Size: Large)   Pulse 72   Resp 18   Ht 6' (1.829 m)   Wt 289 lb 6.4 oz (131.3 kg)   SpO2 96%   BMI 39.25 kg/m       Objective:   Physical Exam  General Mental Status- Alert. General Appearance- Not in acute distress.   Skin General: Color- Normal Color. Moisture- Normal Moisture.  Neck Carotid Arteries- Normal color. Moisture- Normal Moisture. No carotid bruits. No JVD.  Chest and Lung Exam Auscultation: Breath Sounds:-Normal.  Cardiovascular Auscultation:Rythm- Regular. Murmurs & Other Heart Sounds:Auscultation of the heart reveals- No Murmurs.  Abdomen Inspection:-Inspeection Normal. Palpation/Percussion:Note:No mass. Palpation and Percussion of the abdomen reveal- Non Tender, Non Distended + BS, no rebound or guarding.    Neurologic Cranial Nerve exam:- CN III-XII intact(No nystagmus), symmetric smile. Strength:- 5/5 equal and symmetric strength both upper and lower extremities.      Assessment & Plan:  Your bp is decently controlled but little high likely to severe anxiety earlier. Continue current bp medication.  For anxiety, I think combination of effexor  and buspar will be helpful. Can start buspar 1st and then in a week add the sertraline. I gave you counselor info sheet.   Follow up 2 weeks or as needed  [employer], PA-C

## 2020-01-03 ENCOUNTER — Other Ambulatory Visit: Payer: Self-pay | Admitting: Medical

## 2020-01-10 ENCOUNTER — Encounter: Payer: Self-pay | Admitting: Medical

## 2020-01-10 ENCOUNTER — Telehealth: Payer: Self-pay | Admitting: Medical

## 2020-01-10 MED ORDER — VENLAFAXINE HCL ER 37.5 MG PO CP24: 37.5000 mg | ORAL_CAPSULE | Freq: Every day | ORAL | 0 refills | Status: DC

## 2020-01-10 MED ORDER — BUSPIRONE HCL 7.5 MG PO TABS: 7.5000 mg | ORAL_TABLET | Freq: Two times a day (BID) | ORAL | 3 refills | Status: DC

## 2020-01-10 NOTE — Telephone Encounter (Signed)
Rx refill buspar and effexor.

## 2020-01-11 ENCOUNTER — Other Ambulatory Visit: Payer: Self-pay | Admitting: Medical

## 2020-02-01 ENCOUNTER — Other Ambulatory Visit: Payer: Self-pay | Admitting: Medical

## 2020-02-06 ENCOUNTER — Encounter: Payer: Self-pay | Admitting: Medical

## 2020-03-04 ENCOUNTER — Other Ambulatory Visit: Payer: Self-pay | Admitting: Medical

## 2020-04-02 ENCOUNTER — Other Ambulatory Visit: Payer: Self-pay | Admitting: Medical

## 2020-05-01 ENCOUNTER — Other Ambulatory Visit: Payer: Self-pay | Admitting: Medical

## 2020-06-02 ENCOUNTER — Other Ambulatory Visit: Payer: Self-pay | Admitting: Medical

## 2020-06-02 ENCOUNTER — Encounter: Payer: Self-pay | Admitting: Medical

## 2020-06-05 ENCOUNTER — Ambulatory Visit: Payer: Managed Care, Other (non HMO) | Admitting: Medical

## 2020-07-02 ENCOUNTER — Other Ambulatory Visit: Payer: Self-pay | Admitting: Medical

## 2020-07-12 ENCOUNTER — Other Ambulatory Visit: Payer: Self-pay | Admitting: Medical

## 2020-07-17 ENCOUNTER — Other Ambulatory Visit: Payer: Self-pay | Admitting: Medical

## 2020-07-30 ENCOUNTER — Other Ambulatory Visit: Payer: Self-pay | Admitting: Medical

## 2020-08-05 ENCOUNTER — Ambulatory Visit: Payer: Managed Care, Other (non HMO) | Admitting: Medical

## 2020-08-09 ENCOUNTER — Encounter: Payer: Self-pay | Admitting: Medical

## 2020-08-09 ENCOUNTER — Ambulatory Visit (INDEPENDENT_AMBULATORY_CARE_PROVIDER_SITE_OTHER): Payer: BC Managed Care – PPO | Admitting: Medical

## 2020-08-09 ENCOUNTER — Other Ambulatory Visit: Payer: Self-pay

## 2020-08-09 VITALS — BP 135/85 | HR 87 | Resp 18 | Ht 73.0 in | Wt 288.0 lb

## 2020-08-09 DIAGNOSIS — I10 Essential (primary) hypertension: Secondary | ICD-10-CM

## 2020-08-09 DIAGNOSIS — R234 Changes in skin texture: Secondary | ICD-10-CM

## 2020-08-09 DIAGNOSIS — F419 Anxiety disorder, unspecified: Secondary | ICD-10-CM

## 2020-08-09 DIAGNOSIS — R739 Hyperglycemia, unspecified: Secondary | ICD-10-CM | POA: Diagnosis not present

## 2020-08-09 DIAGNOSIS — Z23 Encounter for immunization: Secondary | ICD-10-CM | POA: Diagnosis not present

## 2020-08-09 MED ORDER — MUPIROCIN 2 % EX OINT: 1.0000 "application " | TOPICAL_OINTMENT | Freq: Two times a day (BID) | CUTANEOUS | 0 refills | Status: DC

## 2020-08-09 MED ORDER — LOSARTAN POTASSIUM-HCTZ 100-12.5 MG PO TABS: 1.0000 | ORAL_TABLET | Freq: Every day | ORAL | 3 refills | Status: DC

## 2020-08-09 MED ORDER — BUSPIRONE HCL 15 MG PO TABS: 15.0000 mg | ORAL_TABLET | Freq: Two times a day (BID) | ORAL | 3 refills | Status: DC

## 2020-08-09 NOTE — Progress Notes (Signed)
Subjective:    Patient ID: Mark Dorsey, male    DOB: Aug 30, 1976, 44 y.o.   MRN: [id number]  HPI  Pt has htn. His bp readings are 130/80-90. No cardiac or neurologic signs/symptoms. He indicates often diastolic close to 90. On initial check today diastolic was 95.   Pt states his anxiety has been moderately well controlled with buspar alone. In the past he stopped effexor. But recently his anxiety has been worseing. No depression reported.  Pt got flu vaccine today.  Pt has gotten moderna covid vaccine.  1 month rt foot cracked skin. One small area at metatarsal head area and another same region but lateral and longer about 3 cm. Noticed first when he was at beach about one month ago.       Review of Systems  Constitutional: Negative for chills, fatigue and fever.  Respiratory: Negative for chest tightness, shortness of breath and wheezing.   Cardiovascular: Negative for chest pain and palpitations.  Gastrointestinal: Negative for abdominal pain, constipation and diarrhea.  Musculoskeletal: Negative for back pain and neck pain.  Skin:       Cracked skin botton of rt foot.  Neurological: Negative for dizziness, syncope and weakness.  Hematological: Negative for adenopathy. Does not bruise/bleed easily.  Psychiatric/Behavioral: Negative for behavioral problems, confusion, dysphoric mood and sleep disturbance. The patient is nervous/anxious.     Past Medical History:  Diagnosis Date  . Anxiety   . GERD (gastroesophageal reflux disease)   . Hypertension      Social History   Socioeconomic History  . Marital status: Married    Spouse name: Not on file  . Number of children: Not on file  . Years of education: Not on file  . Highest education level: Not on file  Occupational History  . Not on file  Tobacco Use  . Smoking status: Former Smoker    Types: Cigarettes    Quit date: 2007    Years since quitting: 14.7  . Smokeless tobacco: Never Used  Vaping Use  .  Vaping Use: Never used  Substance and Sexual Activity  . Alcohol use: Yes    Comment: 3 beers a week.  . Drug use: No  . Sexual activity: Yes  Other Topics Concern  . Not on file  Social History Narrative  . Not on file   Social Determinants of Health   Financial Resource Strain:   . Difficulty of Paying Living Expenses: Not on file  Food Insecurity:   . Worried About Charity fundraiser in the Last Year: Not on file  . Ran Out of Food in the Last Year: Not on file  Transportation Needs:   . Lack of Transportation (Medical): Not on file  . Lack of Transportation (Non-Medical): Not on file  Physical Activity:   . Days of Exercise per Week: Not on file  . Minutes of Exercise per Session: Not on file  Stress:   . Feeling of Stress : Not on file  Social Connections:   . Frequency of Communication with Friends and Family: Not on file  . Frequency of Social Gatherings with Friends and Family: Not on file  . Attends Religious Services: Not on file  . Active Member of Clubs or Organizations: Not on file  . Attends Archivist Meetings: Not on file  . Marital Status: Not on file  Intimate Partner Violence:   . Fear of Current or Ex-Partner: Not on file  . Emotionally Abused: Not on file  .  Physically Abused: Not on file  . Sexually Abused: Not on file    Past Surgical History:  Procedure Laterality Date  . NO PAST SURGERIES      Family History  Problem Relation Age of Onset  . Nephrolithiasis Mother   . Diabetes Father   . Hypertension Father   . Hematuria Father   . Colon polyps Father   . Hypertension Sister   . Hypertension Paternal Grandmother   . Asthma Paternal Grandmother   . Hematuria Paternal Grandfather   . Colon polyps Paternal Grandfather     Allergies  Allergen Reactions  . Penicillins     Current Outpatient Medications on File Prior to Visit  Medication Sig Dispense Refill  . cyclobenzaprine (FLEXERIL) 10 MG tablet Take 1 tablet (10 mg  total) by mouth at bedtime. (Patient not taking: Reported on 08/09/2020) 7 tablet 0  . hydrocortisone-pramoxine (PROCTOFOAM HC) rectal foam Place 1 applicator rectally 2 (two) times daily. (Patient not taking: Reported on 08/09/2020) 10 g 1  . hydrocortisone-pramoxine (PROCTOFOAM-HC) rectal foam Place 1 applicator rectally 2 (two) times daily. (Patient not taking: Reported on 08/09/2020) 10 g 2  . LORazepam (ATIVAN) 0.5 MG tablet Take 1 tablet (0.5 mg total) by mouth 2 (two) times daily as needed for anxiety. (Patient not taking: Reported on 08/09/2020) 8 tablet 0  . LORazepam (ATIVAN) 0.5 MG tablet 1 tab po bid prn anxiety (Patient not taking: Reported on 08/09/2020) 30 tablet 0  . LORazepam (ATIVAN) 1 MG tablet Take by mouth. (Patient not taking: Reported on 08/09/2020)    . venlafaxine XR (EFFEXOR XR) 37.5 MG 24 hr capsule Take 1 capsule (37.5 mg total) by mouth daily with breakfast. (Patient not taking: Reported on 08/09/2020) 30 capsule 0   Current Facility-Administered Medications on File Prior to Visit  Medication Dose Route Frequency Provider Last Rate Last Admin  . 0.9 %  sodium chloride infusion  500 mL Intravenous Once Armbruster, Carlota Raspberry, MD        BP 135/85   Pulse 87   Resp 18   Ht 6\' 1"  (1.854 m)   Wt 288 lb (130.6 kg)   SpO2 98%   BMI 38.00 kg/m       Objective:   Physical Exam  General- No acute distress. Pleasant patient. Neck- Full range of motion, no jvd Lungs- Clear, even and unlabored. Heart- regular rate and rhythm. Neurologic- CNII- XII grossly intact.  Rt foot- metatarsal head region. Small crack in bottom of foot mid aspect of metatarsal head region. Also distal 4th and 5th metatarsal head region 2 cm length cracked skin. Depth not past epidermis.      Assessment & Plan:  Your bp is borderline high. I think best to rx losartan/hctz today to get better control.  For elevated sugar will put in future a1c.  Also on future labs get cmp and lipid panel.  For  anxiety not ideally controlled increase buspar to 15 mg twice daily.  Flu vaccine today.  For cracked skin on feet, rx mupirocin and advise use over the counter moisturizer with vit E. Refer to podiatrist.  Follow up 3 months or as needed(sooner for cracked skin of foot if podiatrist appointment delayed)

## 2020-08-09 NOTE — Patient Instructions (Signed)
Your bp is borderline high. I think best to rx losartan/hctz today to get better control.  For elevated sugar will put in future a1c.  Also on future labs get cmp and lipid panel.  For anxiety not ideally controlled increase buspar to 15 mg twice daily.  Flu vaccine today.  For cracked skin on feet, rx mupirocin and advise use over the counter moisturizer with vit E. Refer to podiatrist.  Follow up 3 months or as needed(sooner for cracked skin of foot if podiatrist appointment delayed)

## 2020-08-14 NOTE — Addendum Note (Signed)
Addended by: Kelle Darting A on: 08/14/2020 03:36 PM   Modules accepted: Orders

## 2020-08-15 ENCOUNTER — Other Ambulatory Visit: Payer: BC Managed Care – PPO

## 2020-09-02 ENCOUNTER — Ambulatory Visit: Payer: BC Managed Care – PPO | Admitting: Podiatry

## 2020-09-03 ENCOUNTER — Encounter: Payer: Self-pay | Admitting: Medical

## 2020-10-31 ENCOUNTER — Telehealth: Payer: BC Managed Care – PPO | Admitting: Physician Assistant

## 2020-10-31 DIAGNOSIS — R059 Cough, unspecified: Secondary | ICD-10-CM

## 2020-10-31 MED ORDER — BENZONATATE 100 MG PO CAPS: 100.0000 mg | ORAL_CAPSULE | Freq: Three times a day (TID) | ORAL | 1 refills | Status: DC | PRN

## 2020-10-31 MED ORDER — ALBUTEROL SULFATE HFA 108 (90 BASE) MCG/ACT IN AERS: 2.0000 | INHALATION_SPRAY | Freq: Four times a day (QID) | RESPIRATORY_TRACT | 0 refills | Status: DC | PRN

## 2020-10-31 NOTE — Progress Notes (Signed)
We are sorry that you are not feeling well.  Here is how we plan to help!  Based on your presentation I believe you most likely have A cough due to a virus.  This is called viral bronchitis and is best treated by rest, plenty of fluids and control of the cough.  You may use Ibuprofen or Tylenol as directed to help your symptoms.     In addition you may use A prescription cough medication called Tessalon Perles 100mg . You may take 1-2 capsules every 8 hours as needed for your cough.  Albuterol inhaler 1 puff every 4-6 hours as needed for shortness of breath or severe coughing fits.  From your responses in the eVisit questionnaire you describe inflammation in the upper respiratory tract which is causing a significant cough.  This is commonly called Bronchitis and has four common causes:    Allergies  Viral Infections  Acid Reflux  Bacterial Infection Allergies, viruses and acid reflux are treated by controlling symptoms or eliminating the cause. An example might be a cough caused by taking certain blood pressure medications. You stop the cough by changing the medication. Another example might be a cough caused by acid reflux. Controlling the reflux helps control the cough.  USE OF BRONCHODILATOR ("RESCUE") INHALERS: There is a risk from using your bronchodilator too frequently.  The risk is that over-reliance on a medication which only relaxes the muscles surrounding the breathing tubes can reduce the effectiveness of medications prescribed to reduce swelling and congestion of the tubes themselves.  Although you feel brief relief from the bronchodilator inhaler, your asthma may actually be worsening with the tubes becoming more swollen and filled with mucus.  This can delay other crucial treatments, such as oral steroid medications. If you need to use a bronchodilator inhaler daily, several times per day, you should discuss this with your provider.  There are probably better treatments that could  be used to keep your asthma under control.     HOME CARE  Only take medications as instructed by your medical team.  Complete the entire course of an antibiotic.  Drink plenty of fluids and get plenty of rest.  Avoid close contacts especially the very young and the elderly  Cover your mouth if you cough or cough into your sleeve.  Always remember to wash your hands  A steam or ultrasonic humidifier can help congestion.   GET HELP RIGHT AWAY IF:  You develop worsening fever.  You become short of breath  You cough up blood.  Your symptoms persist after you have completed your treatment plan MAKE SURE YOU   Understand these instructions.  Will watch your condition.  Will get help right away if you are not doing well or get worse.  Your e-visit answers were reviewed by a board certified advanced clinical practitioner to complete your personal care plan.  Depending on the condition, your plan could have included both over the counter or prescription medications. If there is a problem please reply  once you have received a response from your provider. Your safety is important to .  If you have drug allergies check your prescription carefully.    You can use MyChart to ask questions about todays visit, request a non-urgent call back, or ask for a work or school excuse for 24 hours related to this e-Visit. If it has been greater than 24 hours you will need to follow up with your provider, or enter a new e-Visit to address those  concerns. You will get an e-mail in the next two days asking about your experience.  I hope that your e-visit has been valuable and will speed your recovery. Thank you for using e-visits.  Greater than 5 minutes, yet less than 10 minutes of time have been spent researching, coordinating, and implementing care for this patient today.  Inda Coke PA-C

## 2020-11-13 ENCOUNTER — Other Ambulatory Visit: Payer: Self-pay | Admitting: Medical

## 2021-02-11 ENCOUNTER — Other Ambulatory Visit: Payer: Self-pay

## 2021-02-11 ENCOUNTER — Telehealth (INDEPENDENT_AMBULATORY_CARE_PROVIDER_SITE_OTHER): Payer: BC Managed Care – PPO | Admitting: Medical

## 2021-02-11 ENCOUNTER — Encounter: Payer: Self-pay | Admitting: Medical

## 2021-02-11 VITALS — BP 117/76 | HR 74 | Temp 99.2°F

## 2021-02-11 DIAGNOSIS — M791 Myalgia, unspecified site: Secondary | ICD-10-CM | POA: Diagnosis not present

## 2021-02-11 DIAGNOSIS — R509 Fever, unspecified: Secondary | ICD-10-CM | POA: Diagnosis not present

## 2021-02-11 NOTE — Patient Instructions (Signed)
Early onset low grade fever and myaglia since this morning. Video visit limits exact dx as well as early onset.   1 covid test negative. Recommend repeat covid test on Thursday morning. Let me know results. Let me know if fever or myalgia persists. Also notify me of new or changing signs or symptoms as this can help direct treatment and dx.  Send me update on Thursday on my chart. If fever, muslce aches or other symptoms persist then would recommend in office visit to work up cause. Fever and muscles aches might indicate cbc, cmp, throat culture, tickbites studies, chest xray etc. Work up better directed after in person physical exam as well or if symptoms change.  Rest hydrate and tylenol for fever presently. But hold tylenol periodically to check if still running fever.  Follow up date estimate Friday or Monday in person.

## 2021-02-11 NOTE — Progress Notes (Signed)
Subjective:    Patient ID: Mark Dorsey, male    DOB: 1976-04-05, 45 y.o.   MRN: [id number]  HPI  Virtual Visit via Video Note  I connected with Mark Dorsey on 02/11/21 at  4:20 PM EDT by a video enabled telemedicine application and verified that I am speaking with the correct person using two identifiers.  Location: Patient: home Provider: office  Participants- pt and myself.     I discussed the limitations of evaluation and management by telemedicine and the availability of in person appointments. The patient expressed understanding and agreed to proceed.  History of Present Illness: Pt in for some recent bodyaches and fever.  Pt states bodyaches came on this morning out of the blue after phone call with boss. Pt states he otc covid test today and was negative. Pt has another covid test. Pt works with coworkers who go to [employer]. No known contact with covid. Family is not sick.  Pt had 3 covid vaccines. Pt never had covid.  No cough, no nasal congestion, no st or sinus pressure.  Pt temp presently 99.2. Early this morning 100.0.Marland Kitchen bp 117/76. Pulse 74. 02 sat no checked.  Pt describes more muscle aches.  No known tick bite. No hiking. No ticks. Found.   Observations/Objective: General-no acute distress, pleasant, oriented. Lungs- on inspection lungs appear unlabored. Neck- no tracheal deviation or jvd on inspection. Neuro- gross motor function appears intact. heent- by inspection of throat no obvoius redness. No hypertrophy though partial view due to video view/parital shadow.  Assessment and Plan: Early onset low grade fever and myaglia since this morning. Video visit limits exact dx as well as early onset.   1 covid test negative. Recommend repeat covid test on Thursday morning. Let me know results. Let me know if fever or myalgia persists. Also notify me of new or changing signs or symptoms as this can help direct treatment and dx.  Send me update on  Thursday on my chart. If fever, muslce aches or other symptoms persist then would recommend in office visit to work up cause. Fever and muscles aches might indicate cbc, cmp, throat culture, tickbites studies, chest xray etc. Work up better directed after in person physical exam as well or if symptoms change.  Rest hydrate and tylenol for fever presently. But hold tylenol periodically to check if still running fever.  Follow up date estimate Friday or Monday in person.  Time spent with patient today was 30   minutes which consisted of chart revdiew, discussing  Potential differential diagnosis, work up if symptoms persists,treatment and documentation. Follow Up Instructions:    I discussed the assessment and treatment plan with the patient. The patient was provided an opportunity to ask questions and all were answered. The patient agreed with the plan and demonstrated an understanding of the instructions.   The patient was advised to call back or seek an in-person evaluation if the symptoms worsen or if the condition fails to improve as anticipated.     Mackie Pai, PA-C    Review of Systems  Constitutional: Positive for fatigue.  HENT: Negative for congestion, ear pain, sinus pressure, sinus pain, sore throat and trouble swallowing.   Respiratory: Negative for cough, chest tightness, shortness of breath and wheezing.   Cardiovascular: Negative for chest pain and palpitations.  Gastrointestinal: Negative for abdominal pain, constipation and diarrhea.  Endocrine: Negative for polydipsia and polyuria.  Genitourinary: Negative for dysuria, flank pain and frequency.  Musculoskeletal: Positive for myalgias. Negative  for back pain.  Skin: Negative for rash.  Neurological: Negative for dizziness, weakness, numbness and headaches.  Hematological: Negative for adenopathy. Does not bruise/bleed easily.  Psychiatric/Behavioral: Negative for behavioral problems and confusion.        Objective:   Physical Exam        Assessment & Plan:

## 2021-07-27 ENCOUNTER — Other Ambulatory Visit: Payer: Self-pay | Admitting: Medical

## 2021-08-05 ENCOUNTER — Encounter: Payer: Self-pay | Admitting: Medical

## 2021-08-05 ENCOUNTER — Ambulatory Visit (HOSPITAL_BASED_OUTPATIENT_CLINIC_OR_DEPARTMENT_OTHER)
Admission: RE | Admit: 2021-08-05 | Discharge: 2021-08-05 | Disposition: A | Discharge status: Home / Self Care | Payer: BC Managed Care – PPO | Source: Ambulatory Visit | Attending: Medical | Admitting: Medical

## 2021-08-05 ENCOUNTER — Other Ambulatory Visit: Payer: Self-pay

## 2021-08-05 ENCOUNTER — Ambulatory Visit: Payer: BC Managed Care – PPO | Admitting: Medical

## 2021-08-05 VITALS — BP 129/80 | HR 78 | Temp 97.9°F | Resp 18 | Ht 73.0 in | Wt 268.0 lb

## 2021-08-05 DIAGNOSIS — F419 Anxiety disorder, unspecified: Secondary | ICD-10-CM

## 2021-08-05 DIAGNOSIS — I10 Essential (primary) hypertension: Secondary | ICD-10-CM

## 2021-08-05 DIAGNOSIS — M7732 Calcaneal spur, left foot: Secondary | ICD-10-CM | POA: Diagnosis not present

## 2021-08-05 DIAGNOSIS — M79672 Pain in left foot: Secondary | ICD-10-CM | POA: Diagnosis not present

## 2021-08-05 DIAGNOSIS — M79609 Pain in unspecified limb: Secondary | ICD-10-CM

## 2021-08-05 DIAGNOSIS — M25572 Pain in left ankle and joints of left foot: Secondary | ICD-10-CM

## 2021-08-05 DIAGNOSIS — M25562 Pain in left knee: Secondary | ICD-10-CM

## 2021-08-05 DIAGNOSIS — M79662 Pain in left lower leg: Secondary | ICD-10-CM | POA: Diagnosis not present

## 2021-08-05 MED ORDER — VENLAFAXINE HCL ER 37.5 MG PO CP24: 37.5000 mg | ORAL_CAPSULE | Freq: Every day | ORAL | 0 refills | Status: DC

## 2021-08-05 MED ORDER — CLONAZEPAM 0.5 MG PO TABS: 0.5000 mg | ORAL_TABLET | Freq: Two times a day (BID) | ORAL | 0 refills | Status: DC | PRN

## 2021-08-05 MED ORDER — DICLOFENAC SODIUM 75 MG PO TBEC: 75.0000 mg | DELAYED_RELEASE_TABLET | Freq: Two times a day (BID) | ORAL | 0 refills | Status: DC

## 2021-08-05 NOTE — Progress Notes (Signed)
Subjective:    Patient ID: Mark Dorsey, male    DOB: 04-04-1976, 45 y.o.   MRN: [id number]  HPI Pt in with some left foot pain. Pain today more in the heel. Pt states maybe some ankle pain.  Notes when wear dress shoes left foot will hurt.  Pt states for years he can rotate ankle and cause popping.   Pain in heel and ankle for 2 months.  Also some new knee pain for one week. Also some pain behind knee.   About to go [place] in a week and half.  Hx of htn. Pt on losartan-hctz 100-12.5 mg daily.  Pt recently anxious. States he is more anxious. He feels that he getting hang over feel with buspar. He is only taking as needed though advised to take daily. If takes daily he states feels zoned.   Review of Systems  Constitutional:  Negative for chills, fatigue and fever.  Respiratory:  Negative for cough, chest tightness, shortness of breath and wheezing.   Cardiovascular:  Negative for chest pain and palpitations.  Gastrointestinal:  Negative for abdominal pain.  Genitourinary:  Negative for dysuria, enuresis and flank pain.  Musculoskeletal:        See hpi.  Skin:  Negative for rash.  Neurological:  Negative for dizziness, seizures and headaches.  Hematological:  Negative for adenopathy. Does not bruise/bleed easily.  Psychiatric/Behavioral:  Negative for behavioral problems, dysphoric mood, sleep disturbance and suicidal ideas. The patient is nervous/anxious.    Past Medical History:  Diagnosis Date   Anxiety    GERD (gastroesophageal reflux disease)    Hypertension      Social History   Socioeconomic History   Marital status: Married    Spouse name: Not on file   Number of children: Not on file   Years of education: Not on file   Highest education level: Not on file  Occupational History   Not on file  Tobacco Use   Smoking status: Former    Types: Cigarettes    Quit date: 2007    Years since quitting: 15.7   Smokeless tobacco: Never  Vaping Use    Vaping Use: Never used  Substance and Sexual Activity   Alcohol use: Yes    Comment: 3 beers a week.   Drug use: No   Sexual activity: Yes  Other Topics Concern   Not on file  Social History Narrative   Not on file   Social Determinants of Health   Financial Resource Strain: Not on file  Food Insecurity: Not on file  Transportation Needs: Not on file  Physical Activity: Not on file  Stress: Not on file  Social Connections: Not on file  Intimate Partner Violence: Not on file    Past Surgical History:  Procedure Laterality Date   NO PAST SURGERIES      Family History  Problem Relation Age of Onset   Nephrolithiasis Mother    Diabetes Father    Hypertension Father    Hematuria Father    Colon polyps Father    Hypertension Sister    Hypertension Paternal Grandmother    Asthma Paternal Grandmother    Hematuria Paternal Grandfather    Colon polyps Paternal Grandfather     Allergies  Allergen Reactions   Penicillins     Current Outpatient Medications on File Prior to Visit  Medication Sig Dispense Refill   busPIRone (BUSPAR) 15 MG tablet Take 1 tablet (15 mg total) by mouth 2 (two) times  daily. 60 tablet 3   losartan-hydrochlorothiazide (HYZAAR) 100-12.5 MG tablet TAKE ONE TABLET BY MOUTH ONE TIME DAILY 30 tablet 0   Current Facility-Administered Medications on File Prior to Visit  Medication Dose Route Frequency Provider Last Rate Last Admin   0.9 %  sodium chloride infusion  500 mL Intravenous Once Armbruster, Carlota Raspberry, MD        BP 131/85   Pulse 78   Temp 97.9 F (36.6 C)   Resp 18   Ht 6\' 1"  (1.854 m)   Wt 268 lb (121.6 kg)   SpO2 100%   BMI 35.36 kg/m        Objective:   Physical Exam  General Mental Status- Alert. General Appearance- Not in acute distress.   Skin General: Color- Normal Color. Moisture- Normal Moisture.  Neck Carotid Arteries- Normal color. Moisture- Normal Moisture. No carotid bruits. No JVD.  Chest and Lung  Exam Auscultation: Breath Sounds:-Normal.  Cardiovascular Auscultation:Rythm- Regular. Murmurs & Other Heart Sounds:Auscultation of the heart reveals- No Murmurs.  Abdomen Inspection:-Inspeection Normal. Palpation/Percussion:Note:No mass. Palpation and Percussion of the abdomen reveal- Non Tender, Non Distended + BS, no rebound or guarding.   Neurologic Cranial Nerve exam:- CN III-XII intact(No nystagmus), symmetric smile. Strength:- 5/5 equal and symmetric strength both upper and lower extremities.   Left knee- mild pain on flexion and extension minimal. Left knee- mild popliteal pain on palpation.  Left calf- no swelling.  Left medial ankle- mild tender to palpation. Left foot- direct heel tender to palpatio.    Assessment & Plan:   Patient Instructions  For left heel pain, foot pain, ankle pain, knee pain and popliteal pain will get x-rays of areas as well as ultrasound of left lower extremity.  For pain prescribe diclofenac to use twice daily.  Consider going ahead and using Dr. Darrick Grinder heel pads.   For anxiety, advised Effexor 37.5mg  daily and getting limited number of clonazepam to use for severe anxiety/panic attack.  Stop BuSpar due to described side effects.  History of hypertension.  Blood pressure adequately controlled today.  Continue losartan HCTZ.  Follow-up in 2 to 3 weeks or sooner if needed.   Mackie Pai, PA-C

## 2021-08-05 NOTE — Patient Instructions (Signed)
For left heel pain, foot pain, ankle pain, knee pain and popliteal pain will get x-rays of areas as well as ultrasound of left lower extremity.  For pain prescribe diclofenac to use twice daily.  Consider going ahead and using Dr. Darrick Grinder heel pads.   For anxiety, advised Effexor 37.5mg  daily and getting limited number of clonazepam to use for severe anxiety/panic attack.  Stop BuSpar due to described side effects.  History of hypertension.  Blood pressure adequately controlled today.  Continue losartan HCTZ.  Follow-up in 2 to 3 weeks or sooner if needed.

## 2021-08-26 ENCOUNTER — Other Ambulatory Visit: Payer: Self-pay | Admitting: Medical

## 2021-09-01 ENCOUNTER — Other Ambulatory Visit: Payer: Self-pay | Admitting: Medical

## 2021-10-06 ENCOUNTER — Other Ambulatory Visit: Payer: Self-pay | Admitting: Medical

## 2021-10-07 ENCOUNTER — Other Ambulatory Visit: Payer: Self-pay | Admitting: Medical

## 2021-10-07 ENCOUNTER — Telehealth: Payer: Self-pay | Admitting: Medical

## 2021-10-07 ENCOUNTER — Encounter: Payer: Self-pay | Admitting: Medical

## 2021-10-07 MED ORDER — CLONAZEPAM 0.5 MG PO TABS: 0.5000 mg | ORAL_TABLET | Freq: Two times a day (BID) | ORAL | 0 refills | Status: DC | PRN

## 2021-10-07 MED ORDER — VENLAFAXINE HCL ER 37.5 MG PO CP24: 37.5000 mg | ORAL_CAPSULE | Freq: Every day | ORAL | 0 refills | Status: DC

## 2021-10-07 NOTE — Telephone Encounter (Signed)
Requesting: clonazepam Contract: none UDS: none Last Visit: 08/05/21 Next Visit: none Last Refill: 08/05/21  Please Advise

## 2021-10-07 NOTE — Telephone Encounter (Signed)
Refilled effexor and clonazepam today.

## 2021-11-08 ENCOUNTER — Other Ambulatory Visit: Payer: Self-pay | Admitting: Medical

## 2021-11-15 ENCOUNTER — Other Ambulatory Visit: Payer: Self-pay | Admitting: Medical

## 2021-12-16 ENCOUNTER — Encounter: Payer: Self-pay | Admitting: Medical

## 2021-12-18 ENCOUNTER — Ambulatory Visit: Payer: BC Managed Care – PPO | Admitting: Medical

## 2021-12-18 VITALS — BP 120/80 | HR 82 | Temp 98.2°F | Resp 18 | Ht 73.0 in | Wt 268.0 lb

## 2021-12-18 DIAGNOSIS — I10 Essential (primary) hypertension: Secondary | ICD-10-CM | POA: Diagnosis not present

## 2021-12-18 DIAGNOSIS — R000 Tachycardia, unspecified: Secondary | ICD-10-CM

## 2021-12-18 DIAGNOSIS — F419 Anxiety disorder, unspecified: Secondary | ICD-10-CM | POA: Diagnosis not present

## 2021-12-18 DIAGNOSIS — R002 Palpitations: Secondary | ICD-10-CM

## 2021-12-18 LAB — T4, FREE: Free T4: 0.91 ng/dL (ref 0.60–1.60)

## 2021-12-18 LAB — TSH: TSH: 0.73 u[IU]/mL (ref 0.35–5.50)

## 2021-12-18 MED ORDER — VENLAFAXINE HCL ER 37.5 MG PO CP24: ORAL_CAPSULE | ORAL | 3 refills | Status: DC

## 2021-12-18 NOTE — Progress Notes (Signed)
Subjective:    Patient ID: Mark Dorsey, male    DOB: [birth year], 46 y.o.   MRN: [id number]  HPI Pt sent me various readings of pulse in early am after his fitbit alarm went off. He had high pulse with reported atrial fibrillation alarm. Pt felt fine with no cardiac type signs or symptoms. See various messages sent me.  Later sent me bp readings in low range 96/79, 102/77 with pulse in range 61 and 78.  He stopped bp meds yesterday  and got 135/82, 133/82 with pulses 65 and 66.  Last night no alert from fit bit.   He states he has been feeling well just getting asymptomatic alerts.   Anxeity controlled with effexor.  Review of Systems  Constitutional:  Negative for chills, fatigue and fever.  HENT:  Negative for congestion.   Respiratory:  Negative for cough, chest tightness, shortness of breath and wheezing.   Cardiovascular:  Negative for chest pain and palpitations.  Gastrointestinal:  Negative for abdominal pain, blood in stool, nausea and vomiting.  Genitourinary:  Negative for dysuria, flank pain and frequency.  Musculoskeletal:  Negative for back pain.  Skin:  Negative for rash.    Past Medical History:  Diagnosis Date   Anxiety    GERD (gastroesophageal reflux disease)    Hypertension      Social History   Socioeconomic History   Marital status: Married    Spouse name: Not on file   Number of children: Not on file   Years of education: Not on file   Highest education level: Not on file  Occupational History   Not on file  Tobacco Use   Smoking status: Former    Types: Cigarettes    Quit date: 2007    Years since quitting: 16.1   Smokeless tobacco: Never  Vaping Use   Vaping Use: Never used  Substance and Sexual Activity   Alcohol use: Yes    Comment: 3 beers a week.   Drug use: No   Sexual activity: Yes  Other Topics Concern   Not on file  Social History Narrative   Not on file   Social Determinants of Health   Financial Resource Strain:  Not on file  Food Insecurity: Not on file  Transportation Needs: Not on file  Physical Activity: Not on file  Stress: Not on file  Social Connections: Not on file  Intimate Partner Violence: Not on file    Past Surgical History:  Procedure Laterality Date   NO PAST SURGERIES      Family History  Problem Relation Age of Onset   Nephrolithiasis Mother    Diabetes Father    Hypertension Father    Hematuria Father    Colon polyps Father    Hypertension Sister    Hypertension Paternal Grandmother    Asthma Paternal Grandmother    Hematuria Paternal Grandfather    Colon polyps Paternal Grandfather     Allergies  Allergen Reactions   Penicillins     Current Outpatient Medications on File Prior to Visit  Medication Sig Dispense Refill   busPIRone (BUSPAR) 15 MG tablet Take 1 tablet (15 mg total) by mouth 2 (two) times daily. 60 tablet 3   clonazePAM (KLONOPIN) 0.5 MG tablet Take 1 tablet (0.5 mg total) by mouth 2 (two) times daily as needed for anxiety. 4 tablet 0   diclofenac (VOLTAREN) 75 MG EC tablet Take 1 tablet (75 mg total) by mouth 2 (two) times daily. 20 tablet  0   losartan-hydrochlorothiazide (HYZAAR) 100-12.5 MG tablet TAKE ONE TABLET BY MOUTH ONE TIME DAILY 90 tablet 3   venlafaxine XR (EFFEXOR-XR) 37.5 MG 24 hr capsule TAKE ONE CAPSULE BY MOUTH EVERY MORNING WITH BREAKFAST 30 capsule 0   Current Facility-Administered Medications on File Prior to Visit  Medication Dose Route Frequency Provider Last Rate Last Admin   0.9 %  sodium chloride infusion  500 mL Intravenous Once Armbruster, Carlota Raspberry, MD        BP 120/80    Pulse 82    Temp 98.2 F (36.8 C)    Resp 18    Ht 6\' 1"  (1.854 m)    Wt 268 lb (121.6 kg)    SpO2 97%    BMI 35.36 kg/m       Objective:   Physical Exam  General Mental Status- Alert. General Appearance- Not in acute distress.   Skin General: Color- Normal Color. Moisture- Normal Moisture.  Neck Carotid Arteries- Normal color. Moisture-  Normal Moisture. No carotid bruits. No JVD.  Chest and Lung Exam Auscultation: Breath Sounds:-Normal.  Cardiovascular Auscultation:Rythm- Regular. Murmurs & Other Heart Sounds:Auscultation of the heart reveals- No Murmurs.  Abdomen Inspection:-Inspeection Normal. Palpation/Percussion:Note:No mass. Palpation and Percussion of the abdomen reveal- Non Tender, Non Distended + BS, no rebound or guarding.   Neurologic Cranial Nerve exam:- CN III-XII intact(No nystagmus), symmetric smile. Strength:- 5/5 equal and symmetric strength both upper and lower extremities.       Assessment & Plan:   Patient Instructions  Htn- with recent low bp when checked 2 days ago. Then yesterday without med and today bp is in reasonable controlled range. Stay off bp med presently and will get cbc with cmp. Keep checking bp daily. If bp reading go over 140/90 then restart med losartan/hctz. Give me update in 3 days on your bp readings.  Your ekg shows normal sinus rhythm with possible occasional ectopic beat. No tachydardia or atrial fibriallation as you fit bit indicated. I question whether you are getting more ectopic bits and fit bit misreading? Recommend avoiding all cafeine and will refer you to cardiologist for further evalution. May befit from ziopatch monitor. If during interim any symptomatic palpitatio, chest pain/cardiac like symptoms then recommend ED evaluation.  Anxiety stable/controlled. Refilled effexor. Continue to use xanax sparingly only for rare panic attacks.  Follow up in 3-4 weeks or sooner if needed.   Mackie Pai, PA-C

## 2021-12-18 NOTE — Patient Instructions (Addendum)
Htn- with recent low bp when checked 2 days ago. Then yesterday without med and today bp is in reasonable controlled range. Stay off bp med presently and will get cbc with cmp. Keep checking bp daily. If bp reading go over 140/90 then restart med losartan/hctz. Give me update in 3 days on your bp readings.  Your ekg shows normal sinus rhythm with possible occasional ectopic beat. No tachydardia or atrial fibriallation as you fit bit indicated. I question whether you are getting more ectopic bits and fit bit misreading? Recommend avoiding all cafeine and will refer you to cardiologist for further evalution. May befit from ziopatch monitor. If during interim any symptomatic palpitatio, chest pain/cardiac like symptoms then recommend ED evaluation.  Anxiety stable/controlled. Refilled effexor. Continue to use xanax sparingly only for rare panic attacks.  Follow up in 3-4 weeks or sooner if needed.

## 2021-12-31 ENCOUNTER — Ambulatory Visit (INDEPENDENT_AMBULATORY_CARE_PROVIDER_SITE_OTHER): Payer: BC Managed Care – PPO | Admitting: Medical

## 2021-12-31 ENCOUNTER — Encounter: Payer: Self-pay | Admitting: Medical

## 2021-12-31 VITALS — BP 130/75 | HR 87 | Resp 18 | Ht 73.0 in | Wt 263.5 lb

## 2021-12-31 DIAGNOSIS — I10 Essential (primary) hypertension: Secondary | ICD-10-CM | POA: Diagnosis not present

## 2021-12-31 MED ORDER — LOSARTAN POTASSIUM 50 MG PO TABS: 50.0000 mg | ORAL_TABLET | Freq: Every day | ORAL | 0 refills | Status: DC

## 2021-12-31 NOTE — Progress Notes (Signed)
? ?Subjective:  ? ? Patient ID: Mark Dorsey, male    DOB: [birth year], 46 y.o.   MRN: [id number] ? ?HPI ?When he woke up bp was high in 143/101 first at 7 am. Then second check at home 132/98. Pt took his at home reading before meds. Usually takes at 8 am. ? ?Pt took his bp this morning and was 167/103 at [facility]. Then rechecked 144/113.  ? ?No cardiac or neurologic signs or symptoms.  ? ?Pt did eat [employer] last night and had cheeseburgers. With french fries. Also very high stressful day yesterday. ? ?Pt was getting lower bp readings. See last note. Based on his very low bp readings discussed can stop bp meds.  ? ?Pt has been loosing wt. 30-40 pounds. ? ?Bp tabs today that he took was first time in 2 weeks. He was checking his bp daily since stopping med and this was first readigs that was high. His bp reading have been 120/70. ? ? ? ? ?Review of Systems  ?Constitutional:  Negative for chills, fatigue and fever.  ?Respiratory:  Negative for cough, chest tightness, shortness of breath and wheezing.   ?Cardiovascular:  Negative for chest pain and palpitations.  ?Gastrointestinal:  Negative for abdominal pain.  ?Genitourinary:  Negative for dysuria and flank pain.  ?Musculoskeletal:  Negative for back pain.  ?Neurological:  Negative for dizziness, seizures, speech difficulty, light-headedness, numbness and headaches.  ?Hematological:  Negative for adenopathy. Does not bruise/bleed easily.  ?Psychiatric/Behavioral:  Negative for behavioral problems and confusion.   ? ?Past Medical History:  ?Diagnosis Date  ? Anxiety   ? GERD (gastroesophageal reflux disease)   ? Hypertension   ? ?  ?Social History  ? ?Socioeconomic History  ? Marital status: Married  ?  Spouse name: Not on file  ? Number of children: Not on file  ? Years of education: Not on file  ? Highest education level: Not on file  ?Occupational History  ? Not on file  ?Tobacco Use  ? Smoking status: Former  ?  Types: Cigarettes  ?  Quit date: 2007  ?  Years  since quitting: 16.1  ? Smokeless tobacco: Never  ?Vaping Use  ? Vaping Use: Never used  ?Substance and Sexual Activity  ? Alcohol use: Yes  ?  Comment: 3 beers a week.  ? Drug use: No  ? Sexual activity: Yes  ?Other Topics Concern  ? Not on file  ?Social History Narrative  ? Not on file  ? ?Social Determinants of Health  ? ?Financial Resource Strain: Not on file  ?Food Insecurity: Not on file  ?Transportation Needs: Not on file  ?Physical Activity: Not on file  ?Stress: Not on file  ?Social Connections: Not on file  ?Intimate Partner Violence: Not on file  ? ? ?Past Surgical History:  ?Procedure Laterality Date  ? NO PAST SURGERIES    ? ? ?Family History  ?Problem Relation Age of Onset  ? Nephrolithiasis Mother   ? Diabetes Father   ? Hypertension Father   ? Hematuria Father   ? Colon polyps Father   ? Hypertension Sister   ? Hypertension Paternal Grandmother   ? Asthma Paternal Grandmother   ? Hematuria Paternal Grandfather   ? Colon polyps Paternal Grandfather   ? ? ?Allergies  ?Allergen Reactions  ? Penicillins   ? ? ?Current Outpatient Medications on File Prior to Visit  ?Medication Sig Dispense Refill  ? busPIRone (BUSPAR) 15 MG tablet Take 1 tablet (15 mg total)  by mouth 2 (two) times daily. 60 tablet 3  ? clonazePAM (KLONOPIN) 0.5 MG tablet Take 1 tablet (0.5 mg total) by mouth 2 (two) times daily as needed for anxiety. 4 tablet 0  ? diclofenac (VOLTAREN) 75 MG EC tablet Take 1 tablet (75 mg total) by mouth 2 (two) times daily. 20 tablet 0  ? losartan-hydrochlorothiazide (HYZAAR) 100-12.5 MG tablet TAKE ONE TABLET BY MOUTH ONE TIME DAILY 90 tablet 3  ? venlafaxine XR (EFFEXOR-XR) 37.5 MG 24 hr capsule TAKE ONE CAPSULE BY MOUTH EVERY MORNING WITH BREAKFAST 30 capsule 3  ? ?Current Facility-Administered Medications on File Prior to Visit  ?Medication Dose Route Frequency Provider Last Rate Last Admin  ? 0.9 %  sodium chloride infusion  500 mL Intravenous Once Armbruster, Carlota Raspberry, MD      ? ? ?BP 130/75    Pulse 87   Resp 18   Ht 6\' 1"  (1.854 m)   Wt 263 lb 8 oz (119.5 kg)   SpO2 99%   BMI 34.76 kg/m?  ?  ? ?   ?Objective:  ? Physical Exam ? ?General ?Mental Status- Alert. General Appearance- Not in acute distress.  ? ?Skin ?General: Color- Normal Color. Moisture- Normal Moisture. ? ?Neck ?Carotid Arteries- Normal color. Moisture- Normal Moisture. No carotid bruits. No JVD. ? ?Chest and Lung Exam ?Auscultation: ?Breath Sounds:-Normal. ? ?Cardiovascular ?Auscultation:Rythm- Regular. ?Murmurs & Other Heart Sounds:Auscultation of the heart reveals- No Murmurs. ? ?Abdomen ?Inspection:-Inspeection Normal. ?Palpation/Percussion:Note:No mass. Palpation and Percussion of the abdomen reveal- Non Tender, Non Distended + BS, no rebound or guarding. ? ?Neurologic ?Cranial Nerve exam:- CN III-XII intact(No nystagmus), symmetric smile. ?Strength:- 5/5 equal and symmetric strength both upper and lower extremities.  ? ? ?   ?Assessment & Plan:  ? ?Patient Instructions  ?Htn- moderate and high this morning.  This occurred after a very stressful day yesterday and eating high salt meal last night.  Blood pressure is good today after restarting your blood pressure medication.  I want you to continue your blood pressure medication daily, eat low-salt diet and check BP daily.  If your blood pressure starts to downtrend as it previously did on last visit then we will need to modify dose.  Such as possibly decreasing to losartan 50 mg.  Asked that you send me a blood pressure update by MyChart in 10 days and will decide. ? ?Note if you are seeing diastolics on systolics less than 270 then notify me before 10-day MyChart message. ? ?Follow-up in 10 days or sooner if needed. ?  ?Mackie Pai, PA-C  ?

## 2021-12-31 NOTE — Patient Instructions (Addendum)
Htn- moderate and high this morning.  This occurred after a very stressful day yesterday and eating high salt meal last night.  Blood pressure is good today after restarting your blood pressure medication.  I want you to continue your blood pressure medication daily, eat low-salt diet and check BP daily.  If your blood pressure starts to downtrend as it previously did on last visit then we will need to modify dose.  Such as possibly decreasing to losartan 50 mg.  Asked that you send me a blood pressure update by MyChart in 10 days and will decide. ? ?Note if you are seeing diastolics on systolics less than 520 then notify me before 10-day MyChart message. ? ?Follow-up in 10 days or sooner if needed. ? ?Later in the day patient sent me a MyChart message with BP reading 101/78.  I decided to go ahead and have him stop Hyzaar 100-12.5 mg  daily dose and start losartan 50 mg daily. ?

## 2021-12-31 NOTE — Addendum Note (Signed)
Addended by: Anabel Halon on: 12/31/2021 05:29 PM ? ? Modules accepted: Orders ? ?

## 2022-01-08 DIAGNOSIS — I10 Essential (primary) hypertension: Secondary | ICD-10-CM | POA: Insufficient documentation

## 2022-01-08 DIAGNOSIS — F419 Anxiety disorder, unspecified: Secondary | ICD-10-CM | POA: Insufficient documentation

## 2022-01-12 ENCOUNTER — Ambulatory Visit: Payer: BC Managed Care – PPO | Admitting: Cardiology

## 2022-01-12 ENCOUNTER — Other Ambulatory Visit: Payer: Self-pay

## 2022-01-12 ENCOUNTER — Ambulatory Visit (INDEPENDENT_AMBULATORY_CARE_PROVIDER_SITE_OTHER): Payer: BC Managed Care – PPO

## 2022-01-12 ENCOUNTER — Encounter: Payer: Self-pay | Admitting: Cardiology

## 2022-01-12 VITALS — BP 138/88 | HR 80 | Ht 72.0 in | Wt 260.1 lb

## 2022-01-12 DIAGNOSIS — Z83438 Family history of other disorder of lipoprotein metabolism and other lipidemia: Secondary | ICD-10-CM | POA: Diagnosis not present

## 2022-01-12 DIAGNOSIS — E669 Obesity, unspecified: Secondary | ICD-10-CM

## 2022-01-12 DIAGNOSIS — R011 Cardiac murmur, unspecified: Secondary | ICD-10-CM | POA: Diagnosis not present

## 2022-01-12 DIAGNOSIS — R002 Palpitations: Secondary | ICD-10-CM | POA: Diagnosis not present

## 2022-01-12 DIAGNOSIS — I10 Essential (primary) hypertension: Secondary | ICD-10-CM | POA: Diagnosis not present

## 2022-01-12 HISTORY — DX: Palpitations: R00.2

## 2022-01-12 HISTORY — DX: Obesity, unspecified: E66.9

## 2022-01-12 HISTORY — DX: Cardiac murmur, unspecified: R01.1

## 2022-01-12 NOTE — Progress Notes (Signed)
?Cardiology Office Note:   ? ?Date:  01/12/2022  ? ?ID:  Mark Dorsey, DOB Mar 30, 1976, MRN [id number] ? ?PCP:  Mackie Pai, PA-C  ?Cardiologist:  Jenean Lindau, MD  ? ?Referring MD: Mackie Pai, PA-C  ? ? ?ASSESSMENT:   ? ?1. Hypertension, unspecified type   ?2. Palpitations   ?3. Family history of hyperlipidemia   ?4. Essential hypertension   ?5. Cardiac murmur   ?6. Obesity (BMI 35.0-39.9 without comorbidity)   ? ?PLAN:   ? ?In order of problems listed above: ? ?Primary prevention stressed with the patient.  Importance of compliance with diet medication stressed any vocalized understanding.  He was advised to continue walking.  He manages at least a mile a day without any problems.  He was told to walk at least half an hour a day 5 days a week and he promises to do so. ?Essential hypertension: Blood pressure stable and diet was emphasized.  Lifestyle modification was encouraged. ?Palpitations: I discussed this with him at length.  We will do a 2-week monitor.  I also encouraged him to purchase a cardia app to track symptoms if he has them in the future and he is agreeable. ?Cardiac murmur: Echocardiogram will be done to assess murmur heard on escalation. ?Obesity: Weight reduction stressed.  Diet was emphasized.  I congratulated him on his journey of weight loss. ?Coronary risk stratification: Patient is an ex-smoker and quit 7 years ago.  In view of the risk factors I recommended calcium scoring and is agreeable.  This will also help me guide him for lipid management. ?Patient will be seen in follow-up appointment in 6 months or earlier if the patient has any concerns ? ? ? ? ? ?Medication Adjustments/Labs and Tests Ordered: ?Current medicines are reviewed at length with the patient today.  Concerns regarding medicines are outlined above.  ?Orders Placed This Encounter  ?Procedures  ? CT CARDIAC SCORING  ? LONG TERM MONITOR (3-14 DAYS)  ? EKG 12-Lead  ? ECHOCARDIOGRAM COMPLETE  ? ?No orders of the  defined types were placed in this encounter. ? ? ? ?History of Present Illness:   ? ?Mark Dorsey is a 46 y.o. male who is being seen today for the evaluation of palpitations at the request of [place], Percell Miller, [place].  Patient is a pleasant 46 year old male.  He has past medical history of essential hypertension.  He mentions to me that he had issues with palpitations and therefore he was referred here.  More than the palpitations his Fitbit revealed also had a irregular heartbeat.  Therefore he was referred here for evaluation.  He denies any chest pain orthopnea or PND.  He has lost a significant amount of weight with diet and exercise and he is happy about it.  He is still in the process.  At the time of my evaluation, the patient is alert awake oriented and in no distress. ? ?Past Medical History:  ?Diagnosis Date  ? Anxiety   ? Essential hypertension 05/30/2015  ? Family history of diabetes mellitus (DM) 05/31/2015  ? Family history of hyperlipidemia 05/31/2015  ? Gastroesophageal reflux disease without esophagitis 05/31/2015  ? GERD (gastroesophageal reflux disease)   ? Hypertension   ? Migraine without aura 05/31/2015  ? Panic attacks 05/30/2015  ? ? ?Past Surgical History:  ?Procedure Laterality Date  ? NO PAST SURGERIES    ? ? ?Current Medications: ?Current Meds  ?Medication Sig  ? losartan (COZAAR) 50 MG tablet Take 100 mg by mouth daily.  ?  venlafaxine XR (EFFEXOR-XR) 37.5 MG 24 hr capsule TAKE ONE CAPSULE BY MOUTH EVERY MORNING WITH BREAKFAST  ?  ? ?Allergies:   Penicillins  ? ?Social History  ? ?Socioeconomic History  ? Marital status: Married  ?  Spouse name: Not on file  ? Number of children: Not on file  ? Years of education: Not on file  ? Highest education level: Not on file  ?Occupational History  ? Not on file  ?Tobacco Use  ? Smoking status: Former  ?  Types: Cigarettes  ?  Quit date: 2007  ?  Years since quitting: 16.2  ? Smokeless tobacco: Never  ?Vaping Use  ? Vaping Use: Never used  ?Substance  and Sexual Activity  ? Alcohol use: Yes  ?  Comment: 3 beers a week.  ? Drug use: No  ? Sexual activity: Yes  ?Other Topics Concern  ? Not on file  ?Social History Narrative  ? Not on file  ? ?Social Determinants of Health  ? ?Financial Resource Strain: Not on file  ?Food Insecurity: Not on file  ?Transportation Needs: Not on file  ?Physical Activity: Not on file  ?Stress: Not on file  ?Social Connections: Not on file  ?  ? ?Family History: ?The patient's family history includes Asthma in his paternal grandmother; Colon polyps in his father and paternal grandfather; Diabetes in his father; Hematuria in his father and paternal grandfather; Hypertension in his father, paternal grandmother, and sister; Nephrolithiasis in his mother. ? ?ROS:   ?Please see the history of present illness.    ?All other systems reviewed and are negative. ? ?EKGs/Labs/Other Studies Reviewed:   ? ?The following studies were reviewed today: ?EKG reveals sinus rhythm and nonspecific ST-T changes ? ? ?Recent Labs: ?12/18/2021: TSH 0.73  ?Recent Lipid Panel ?   ?Component Value Date/Time  ? CHOL 173 10/11/2018 0705  ? TRIG 196.0 (H) 10/11/2018 0705  ? HDL 30.20 (L) 10/11/2018 0705  ? CHOLHDL 6 10/11/2018 0705  ? VLDL 39.2 10/11/2018 0705  ? [place] 103 (H) 10/11/2018 9924  ? LDLDIRECT 98.0 06/30/2017 0729  ? ? ?Physical Exam:   ? ?VS:  BP 138/88   Pulse 80   Ht 6' (1.829 m)   Wt 260 lb 1.9 oz (118 kg)   SpO2 97%   BMI 35.28 kg/m?    ? ?Wt Readings from Last 3 Encounters:  ?01/12/22 260 lb 1.9 oz (118 kg)  ?12/31/21 263 lb 8 oz (119.5 kg)  ?12/18/21 268 lb (121.6 kg)  ?  ? ?GEN: Patient is in no acute distress ?HEENT: Normal ?NECK: No JVD; No carotid bruits ?LYMPHATICS: No lymphadenopathy ?CARDIAC: S1 S2 regular, 2/6 systolic murmur at the apex. ?RESPIRATORY:  Clear to auscultation without rales, wheezing or rhonchi  ?ABDOMEN: Soft, non-tender, non-distended ?MUSCULOSKELETAL:  No edema; No deformity  ?SKIN: Warm and dry ?NEUROLOGIC:  Alert  and oriented x 3 ?PSYCHIATRIC:  Normal affect  ? ? ?Signed, ?Jenean Lindau, MD  ?01/12/2022 4:03 PM    ?[place]   ?

## 2022-01-12 NOTE — Patient Instructions (Signed)
Medication Instructions:  Your physician recommends that you continue on your current medications as directed. Please refer to the Current Medication list given to you today.  *If you need a refill on your cardiac medications before your next appointment, please call your pharmacy*   Lab Work: None ordered If you have labs (blood work) drawn today and your tests are completely normal, you will receive your results only by: [place] (if you have MyChart) OR A paper copy in the mail If you have any lab test that is abnormal or we need to change your treatment, we will call you to review the results.   Testing/Procedures: Your physician has requested that you have an echocardiogram. Echocardiography is a painless test that uses sound waves to create images of your heart. It provides your doctor with information about the size and shape of your heart and how well your hearts chambers and valves are working. This procedure takes approximately one hour. There are no restrictions for this procedure.  We will order CT coronary calcium score. It will cost $99.00 and is not covered by insurance.  Please call [phone] to schedule in [place].   [facility]  [address] [place], [place] [address]   Please call [phone] to schedule in [facility].  [facility] [address] [place], [place] [address]    Follow-Up: At [facility], you and your health needs are our priority.  As part of our continuing mission to provide you with exceptional heart care, we have created designated Provider Care Teams.  These Care Teams include your primary Cardiologist (physician) and Advanced Practice Providers (APPs -  Physician Assistants and Nurse Practitioners) who all work together to provide you with the care you need, when you need it.  We recommend signing up for the patient portal called "MyChart".  Sign up information is provided on this After Visit  Summary.  MyChart is used to connect with patients for Virtual Visits (Telemedicine).  Patients are able to view lab/test results, encounter notes, upcoming appointments, etc.  Non-urgent messages can be sent to your provider as well.   To learn more about what you can do with MyChart, go to [email or web address].    Your next appointment:   9 month(s)  The format for your next appointment:   In Person  Provider:   Jyl Heinz, MD   Other Instructions Echocardiogram An echocardiogram is a test that uses sound waves (ultrasound) to produce images of the heart. Images from an echocardiogram can provide important information about: Heart size and shape. The size and thickness and movement of your heart's walls. Heart muscle function and strength. Heart valve function or if you have stenosis. Stenosis is when the heart valves are too narrow. If blood is flowing backward through the heart valves (regurgitation). A tumor or infectious growth around the heart valves. Areas of heart muscle that are not working well because of poor blood flow or injury from a heart attack. Aneurysm detection. An aneurysm is a weak or damaged part of an artery wall. The wall bulges out from the normal force of blood pumping through the body. Tell a health care provider about: Any allergies you have. All medicines you are taking, including vitamins, herbs, eye drops, creams, and over-the-counter medicines. Any blood disorders you have. Any surgeries you have had. Any medical conditions you have. Whether you are pregnant or may be pregnant. What are the risks? Generally, this is a  safe test. However, problems may occur, including an allergic reaction to dye (contrast) that may be used during the test. What happens before the test? No specific preparation is needed. You may eat and drink normally. What happens during the test? You will take off your clothes from the waist up and put on a hospital  gown. Electrodes or electrocardiogram (ECG)patches may be placed on your chest. The electrodes or patches are then connected to a device that monitors your heart rate and rhythm. You will lie down on a table for an ultrasound exam. A gel will be applied to your chest to help sound waves pass through your skin. A handheld device, called a transducer, will be pressed against your chest and moved over your heart. The transducer produces sound waves that travel to your heart and bounce back (or "echo" back) to the transducer. These sound waves will be captured in real-time and changed into images of your heart that can be viewed on a video monitor. The images will be recorded on a computer and reviewed by your health care provider. You may be asked to change positions or hold your breath for a short time. This makes it easier to get different views or better views of your heart. In some cases, you may receive contrast through an IV in one of your veins. This can improve the quality of the pictures from your heart. The procedure may vary among health care providers and hospitals.   What can I expect after the test? You may return to your normal, everyday life, including diet, activities, and medicines, unless your health care provider tells you not to do that. Follow these instructions at home: It is up to you to get the results of your test. Ask your health care provider, or the department that is doing the test, when your results will be ready. Keep all follow-up visits. This is important. Summary An echocardiogram is a test that uses sound waves (ultrasound) to produce images of the heart. Images from an echocardiogram can provide important information about the size and shape of your heart, heart muscle function, heart valve function, and other possible heart problems. You do not need to do anything to prepare before this test. You may eat and drink normally. After the echocardiogram is completed, you  may return to your normal, everyday life, unless your health care provider tells you not to do that. This information is not intended to replace advice given to you by your health care provider. Make sure you discuss any questions you have with your health care provider. Document Revised: 06/11/2020 Document Reviewed: 06/11/2020 Elsevier Patient Education  2021 [place].  Coronary Calcium Scan A coronary calcium scan is an imaging test used to look for deposits of plaque in the inner lining of the blood vessels of the heart (coronary arteries). Plaque is made up of calcium, protein, and fatty substances. These deposits of plaque can partly clog and narrow the coronary arteries without producing any symptoms or warning signs. This puts a person at risk for a heart attack. This test is recommended for people who are at moderate risk for heart disease. The test can find plaque deposits before symptoms develop. Tell a health care provider about: Any allergies you have. All medicines you are taking, including vitamins, herbs, eye drops, creams, and over-the-counter medicines. Any problems you or family members have had with anesthetic medicines. Any blood disorders you have. Any surgeries you have had. Any medical conditions you have.  Whether you are pregnant or may be pregnant. What are the risks? Generally, this is a safe procedure. However, problems may occur, including: Harm to a pregnant woman and her unborn baby. This test involves the use of radiation. Radiation exposure can be dangerous to a pregnant woman and her unborn baby. If you are pregnant or think you may be pregnant, you should not have this procedure done. Slight increase in the risk of cancer. This is because of the radiation involved in the test. What happens before the procedure? Ask your health care provider for any specific instructions on how to prepare for this procedure. You may be asked to avoid products that contain  caffeine, tobacco, or nicotine for 4 hours before the procedure. What happens during the procedure?  You will undress and remove any jewelry from your neck or chest. You will put on a hospital gown. Sticky electrodes will be placed on your chest. The electrodes will be connected to an electrocardiogram (ECG) machine to record a tracing of the electrical activity of your heart. You will lie down on a curved bed that is attached to the [place]. You may be given medicine to slow down your heart rate so that clear pictures can be created. You will be moved into the CT scanner, and the CT scanner will take pictures of your heart. During this time, you will be asked to lie still and hold your breath for 2-3 seconds at a time while each picture of your heart is being taken. The procedure may vary among health care providers and hospitals. What happens after the procedure? You can get dressed. You can return to your normal activities. It is up to you to get the results of your procedure. Ask your health care provider, or the department that is doing the procedure, when your results will be ready. Summary A coronary calcium scan is an imaging test used to look for deposits of plaque in the inner lining of the blood vessels of the heart (coronary arteries). Plaque is made up of calcium, protein, and fatty substances. Generally, this is a safe procedure. Tell your health care provider if you are pregnant or may be pregnant. Ask your health care provider for any specific instructions on how to prepare for this procedure. A CT scanner will take pictures of your heart. You can return to your normal activities after the scan is done. This information is not intended to replace advice given to you by your health care provider. Make sure you discuss any questions you have with your health care provider. Document Revised: 05/04/2019 Document Reviewed: 05/09/2019 Elsevier Patient Education  [place].

## 2022-01-13 ENCOUNTER — Telehealth: Payer: Self-pay | Admitting: Medical

## 2022-01-13 ENCOUNTER — Ambulatory Visit: Payer: BC Managed Care – PPO

## 2022-01-13 MED ORDER — LOSARTAN POTASSIUM 100 MG PO TABS: 100.0000 mg | ORAL_TABLET | Freq: Every day | ORAL | 3 refills | Status: DC

## 2022-01-13 NOTE — Telephone Encounter (Signed)
Chart opened to rx med, order lab, review chart, respond to my chart message or send message to staff member  

## 2022-01-15 ENCOUNTER — Ambulatory Visit (HOSPITAL_BASED_OUTPATIENT_CLINIC_OR_DEPARTMENT_OTHER)
Admission: RE | Admit: 2022-01-15 | Discharge: 2022-01-15 | Disposition: A | Discharge status: Home / Self Care | Payer: BC Managed Care – PPO | Source: Ambulatory Visit | Attending: Cardiology | Admitting: Cardiology

## 2022-01-15 ENCOUNTER — Other Ambulatory Visit: Payer: Self-pay

## 2022-01-15 DIAGNOSIS — R011 Cardiac murmur, unspecified: Secondary | ICD-10-CM | POA: Diagnosis not present

## 2022-01-15 LAB — ECHOCARDIOGRAM COMPLETE
Area-P 1/2: 3.79 cm2
S' Lateral: 3.5 cm

## 2022-01-15 NOTE — Progress Notes (Signed)
?  Echocardiogram ?2D Echocardiogram has been performed. ? ?Elmer Ramp ?01/15/2022, 11:23 AM ?

## 2022-02-04 ENCOUNTER — Telehealth: Payer: Self-pay

## 2022-02-04 MED ORDER — METOPROLOL TARTRATE 25 MG PO TABS: 25.0000 mg | ORAL_TABLET | Freq: Two times a day (BID) | ORAL | 1 refills | Status: DC

## 2022-02-04 NOTE — Telephone Encounter (Signed)
Abnormal finding of SVT 22,975 while symptomatic  and 3% burden AFIB as well symptomatic Rate of 190 bpm-60sec, fastest beat at =max HR 245 bpm Longest event was 1 minute and 53 sec. Average rate of 195 HR. Per Raquel Sarna [facility] rep). Per Dr. Agustin Cree  verbally, start patient on Metoprolol Tartrate '25mg'$  bid. Patient notified and medication sent to confirm pharmacy.  ?

## 2022-02-05 ENCOUNTER — Other Ambulatory Visit: Payer: Self-pay | Admitting: Medical

## 2022-02-05 NOTE — Telephone Encounter (Addendum)
Requesting: klonopin  ?Contract: n/a ?UDS: n/a ?Last Visit:12/31/21 ?Next Visit:n/a ?Last Refill:10/2021 ? ?Please Advise  ? ?4 tab rx given. No contract or uds since small volume and rare use. ? ?.mec ?

## 2022-02-13 ENCOUNTER — Encounter: Payer: Self-pay | Admitting: Cardiology

## 2022-02-13 ENCOUNTER — Ambulatory Visit: Payer: BC Managed Care – PPO | Admitting: Cardiology

## 2022-02-13 VITALS — BP 132/80 | HR 81 | Ht 72.0 in | Wt 263.0 lb

## 2022-02-13 DIAGNOSIS — I517 Cardiomegaly: Secondary | ICD-10-CM

## 2022-02-13 DIAGNOSIS — I480 Paroxysmal atrial fibrillation: Secondary | ICD-10-CM | POA: Diagnosis not present

## 2022-02-13 DIAGNOSIS — R002 Palpitations: Secondary | ICD-10-CM

## 2022-02-13 DIAGNOSIS — E669 Obesity, unspecified: Secondary | ICD-10-CM

## 2022-02-13 DIAGNOSIS — I10 Essential (primary) hypertension: Secondary | ICD-10-CM | POA: Diagnosis not present

## 2022-02-13 DIAGNOSIS — R0683 Snoring: Secondary | ICD-10-CM

## 2022-02-13 HISTORY — DX: Cardiomegaly: I51.7

## 2022-02-13 HISTORY — DX: Paroxysmal atrial fibrillation: I48.0

## 2022-02-13 MED ORDER — NEBIVOLOL HCL 5 MG PO TABS
5.0000 mg | ORAL_TABLET | Freq: Every day | ORAL | 3 refills | Status: DC
Start: 2022-02-13 — End: 2023-02-15

## 2022-02-13 NOTE — Addendum Note (Signed)
Addended by: Truddie Hidden on: 02/13/2022 04:20 PM ? ? Modules accepted: Orders ? ?

## 2022-02-13 NOTE — Progress Notes (Signed)
?Cardiology Office Note:   ? ?Date:  02/13/2022  ? ?ID:  Mark Dorsey, DOB 09/24/1976, MRN [id number] ? ?PCP:  Mackie Pai, PA-C  ?Cardiologist:  Jenean Lindau, MD  ? ?Referring MD: Mackie Pai, PA-C  ? ? ?ASSESSMENT:   ? ?1. Essential hypertension   ?2. Palpitations   ?3. PAF (paroxysmal atrial fibrillation) ([place])   ?4. Left atrial enlargement   ?5. Obesity (BMI 35.0-39.9 without comorbidity)   ? ?PLAN:   ? ?In order of problems listed above: ? ?Primary prevention stressed with patient.  Importance of compliance with diet medication stressed any vocalized understanding. ?Paroxysmal atrial fibrillation: This is a new finding and relatively surprising.  He has significant burden of atrial fibrillation with rapid ventricular rate.  He is a young gentleman.  He has some degree of left atrial enlargement.  Before I commit him to lifelong significant medical therapy I discussed with him the choices he had such as electrophysiology evaluation with the possibility of an attempt for ablation.  His cardiac anatomy seems to be fairly preserved based on echo findings.  He is agreeable and we will set him up with our electrophysiology colleagues for that evaluation.  He has not been able to tolerate metoprolol well so we will change him to Bystolic 5 mg daily. ?Obesity: Weight reduction stressed and diet was emphasized.  Lifestyle modification urged weight reduction stressed any promises to do better. ?Snoring: I asked him whether he has any issues with snoring and he tells me that his wife is concerned about his snoring.  He is status sleep apnea.  We will set him up for a sleep study. ?Patient will be seen in follow-up appointment in 6 months or earlier if the patient has any concerns ? ? ? ?Medication Adjustments/Labs and Tests Ordered: ?Current medicines are reviewed at length with the patient today.  Concerns regarding medicines are outlined above.  ?Orders Placed This Encounter  ?Procedures  ? Ambulatory  referral to Cardiac Electrophysiology  ? EKG 12-Lead  ? ?Meds ordered this encounter  ?Medications  ? nebivolol (BYSTOLIC) 5 MG tablet  ?  Sig: Take 1 tablet (5 mg total) by mouth daily.  ?  Dispense:  90 tablet  ?  Refill:  3  ? ? ? ?No chief complaint on file. ?  ? ?History of Present Illness:   ? ?Mark Dorsey is a 46 y.o. male.  Patient has past medical history of essential hypertension, obesity.  He was evaluated for palpitations and monitoring has revealed paroxysmal atrial fibrillation with elevated ventricular rate.  For this reason he is here for follow-up.  Echocardiogram has revealed preserved systolic function with left atrial enlargement.  At the time of my evaluation, the patient is alert awake oriented and in no distress. ? ?Past Medical History:  ?Diagnosis Date  ? Anxiety   ? Cardiac murmur 01/12/2022  ? Essential hypertension 05/30/2015  ? Family history of diabetes mellitus (DM) 05/31/2015  ? Family history of hyperlipidemia 05/31/2015  ? Gastroesophageal reflux disease without esophagitis 05/31/2015  ? Hypertension   ? Migraine without aura 05/31/2015  ? Obesity (BMI 35.0-39.9 without comorbidity) 01/12/2022  ? Palpitations 01/12/2022  ? Panic attacks 05/30/2015  ? ? ?Past Surgical History:  ?Procedure Laterality Date  ? NO PAST SURGERIES    ? ? ?Current Medications: ?Current Meds  ?Medication Sig  ? clonazePAM (KLONOPIN) 0.5 MG tablet TAKE ONE TABLET BY MOUTH TWICE A DAY AS NEEDED FOR ANXIETY  ? losartan (COZAAR) 100 MG tablet  Take 1 tablet (100 mg total) by mouth daily.  ? nebivolol (BYSTOLIC) 5 MG tablet Take 1 tablet (5 mg total) by mouth daily.  ? venlafaxine XR (EFFEXOR-XR) 37.5 MG 24 hr capsule TAKE ONE CAPSULE BY MOUTH EVERY MORNING WITH BREAKFAST  ? [DISCONTINUED] metoprolol tartrate (LOPRESSOR) 25 MG tablet Take 1 tablet (25 mg total) by mouth 2 (two) times daily.  ?  ? ?Allergies:   Penicillins  ? ?Social History  ? ?Socioeconomic History  ? Marital status: Married  ?  Spouse name:  Not on file  ? Number of children: Not on file  ? Years of education: Not on file  ? Highest education level: Not on file  ?Occupational History  ? Not on file  ?Tobacco Use  ? Smoking status: Former  ?  Types: Cigarettes  ?  Quit date: 2007  ?  Years since quitting: 16.2  ? Smokeless tobacco: Never  ?Vaping Use  ? Vaping Use: Never used  ?Substance and Sexual Activity  ? Alcohol use: Yes  ?  Comment: 3 beers a week.  ? Drug use: No  ? Sexual activity: Yes  ?Other Topics Concern  ? Not on file  ?Social History Narrative  ? Not on file  ? ?Social Determinants of Health  ? ?Financial Resource Strain: Not on file  ?Food Insecurity: Not on file  ?Transportation Needs: Not on file  ?Physical Activity: Not on file  ?Stress: Not on file  ?Social Connections: Not on file  ?  ? ?Family History: ?The patient's family history includes Asthma in his paternal grandmother; Colon polyps in his father and paternal grandfather; Diabetes in his father; Hematuria in his father and paternal grandfather; Hypertension in his father, paternal grandmother, and sister; Nephrolithiasis in his mother. ? ?ROS:   ?Please see the history of present illness.    ?All other systems reviewed and are negative. ? ?EKGs/Labs/Other Studies Reviewed:   ? ?The following studies were reviewed today: ? ?EKG done today revealed sinus rhythm with PACs and nonspecific ST-T changes ? ? ?Patch Wear Time:  11 days and 1 hours (2023-03-13T16:10:59-0400 to 2023-03-24T17:35:30-0400) ?  ?Patient had a min HR of 44 bpm, max HR of 245 bpm, and avg HR of 85 bpm.  ?  ?Predominant underlying rhythm was Sinus Rhythm. ?  ?22975 Supraventricular Tachycardia runs occurred, the run with the fastest interval lasting 9 beats with a max rate of 245 bpm, the longest lasting 1 min 53 secs with an avg rate of 195 bpm. Atrial Fibrillation occurred (3% burden), ranging from 71-226 bpm (avg of 140 bpm), the longest lasting 8 hours 31 mins with an avg rate of 140 bpm. Supraventricular  Tachycardia and Atrial  ?Fibrillation were detected within +/- 45 seconds of symptomatic patient event(s). Isolated SVEs were occasional (4.9%, 70155), SVE Couplets were frequent (7.8%, 56085), and SVE Triplets were frequent (9.4%, 44796). ?  ? Isolated VEs were rare (<1.0%), and no VE Couplets or VE Triplets were present. Ventricular Trigeminy was present. MD notification criteria for Rapid Atrial Fibrillation and Supraventricular Tachycardia met - report posted prior to notification per account request (TB). ?  ?Impression: Abnormal event monitor with paroxysmal atrial fibrillation with elevated ventricular rate and supraventricular tachycardia as mentioned above. ? ? ?IMPRESSIONS  ? ? ? 1. Left ventricular ejection fraction, by estimation, is 50 to 55%. The  ?left ventricle has low normal function. The left ventricle has no regional  ?wall motion abnormalities. Left ventricular diastolic parameters were  ?normal.  ?  2. Right ventricular systolic function is normal. The right ventricular  ?size is normal.  ? 3. Left atrial size was moderately dilated.  ? ? ?Recent Labs: ?12/18/2021: TSH 0.73  ?Recent Lipid Panel ?   ?Component Value Date/Time  ? CHOL 173 10/11/2018 0705  ? TRIG 196.0 (H) 10/11/2018 0705  ? HDL 30.20 (L) 10/11/2018 0705  ? CHOLHDL 6 10/11/2018 0705  ? VLDL 39.2 10/11/2018 0705  ? [place] 103 (H) 10/11/2018 7800  ? LDLDIRECT 98.0 06/30/2017 0729  ? ? ?Physical Exam:   ? ?VS:  BP 132/80   Pulse 81   Ht 6' (1.829 m)   Wt 263 lb 0.6 oz (119.3 kg)   SpO2 96%   BMI 35.67 kg/m?    ? ?Wt Readings from Last 3 Encounters:  ?02/13/22 263 lb 0.6 oz (119.3 kg)  ?01/12/22 260 lb 1.9 oz (118 kg)  ?12/31/21 263 lb 8 oz (119.5 kg)  ?  ? ?GEN: Patient is in no acute distress ?HEENT: Normal ?NECK: No JVD; No carotid bruits ?LYMPHATICS: No lymphadenopathy ?CARDIAC: Hear sounds regular, 2/6 systolic murmur at the apex. ?RESPIRATORY:  Clear to auscultation without rales, wheezing or rhonchi  ?ABDOMEN: Soft,  non-tender, non-distended ?MUSCULOSKELETAL:  No edema; No deformity  ?SKIN: Warm and dry ?NEUROLOGIC:  Alert and oriented x 3 ?PSYCHIATRIC:  Normal affect  ? ?Signed, ?Jenean Lindau, MD  ?02/13/2022 3:31 PM    ?[facility]

## 2022-02-13 NOTE — Patient Instructions (Addendum)
Medication Instructions:  ?Your physician has recommended you make the following change in your medication:  ? ?Stop Metoprolol ? ?Start Bystolic 5 mg daily ? ?*If you need a refill on your cardiac medications before your next appointment, please call your pharmacy* ? ? ?Lab Work: ?None ordered ?If you have labs (blood work) drawn today and your tests are completely normal, you will receive your results only by: ?MyChart Message (if you have MyChart) OR ?A paper copy in the mail ?If you have any lab test that is abnormal or we need to change your treatment, we will call you to review the results. ? ? ?Testing/Procedures: ?None ordered ? ? ?Follow-Up: ?At [facility], you and your health needs are our priority.  As part of our continuing mission to provide you with exceptional heart care, we have created designated Provider Care Teams.  These Care Teams include your primary Cardiologist (physician) and Advanced Practice Providers (APPs -  Physician Assistants and Nurse Practitioners) who all work together to provide you with the care you need, when you need it. ? ?We recommend signing up for the patient portal called "MyChart".  Sign up information is provided on this After Visit Summary.  MyChart is used to connect with patients for Virtual Visits (Telemedicine).  Patients are able to view lab/test results, encounter notes, upcoming appointments, etc.  Non-urgent messages can be sent to your provider as well.   ?To learn more about what you can do with MyChart, go to [email or web address].   ? ?Your next appointment:   ?6 month(s) ? ?The format for your next appointment:   ?In Person ? ?Provider:   ?Jyl Heinz, MD ? ? ?Other Instructions ?NA ? ?

## 2022-02-16 ENCOUNTER — Encounter: Payer: Self-pay | Admitting: Cardiology

## 2022-02-24 ENCOUNTER — Telehealth: Payer: Self-pay

## 2022-02-24 NOTE — Telephone Encounter (Signed)
Sleep Apnea Evaluation ? ?[place] ? ?Today's Date: 02/24/2022  ? ?Patient Name: Mark Dorsey        ?DOB: 20-Nov-1975  ?     ?Height: 6 ft         ?Weight: 263   ?BMI:35.67 ?  ?Referring Provider:  Revankar ?  ?STOP-BANG RISK ASSESSMENT    ?   ? ? ?If STOP-BANG Score ?3 OR two clinical symptoms - patient qualifies for WatchPAT (CPT 95800)     ? ?Sleep study ordered due to two (2) of the following clinical symptoms/diagnoses:  ?Excessive daytime sleepiness G47.10  Gastroesophageal reflux K21.9  Nocturia R35.1  Morning Headaches G44.221  Difficulty concentrating R41.840  Memory problems or poor judgment G31.84  Personality changes or irritability R45.4  Loud snoring R06.83  Depression F32.9  Unrefreshed by sleep G47.8  Impotence N52.9  History of high blood pressure R03.0  Insomnia G47.00  Sleep Disordered Breathing or Sleep Apnea ICD G47.33   ? ? ?

## 2022-03-03 ENCOUNTER — Ambulatory Visit (INDEPENDENT_AMBULATORY_CARE_PROVIDER_SITE_OTHER)
Admission: RE | Admit: 2022-03-03 | Discharge: 2022-03-03 | Disposition: A | Discharge status: Home / Self Care | Payer: Self-pay | Source: Ambulatory Visit | Attending: Cardiology | Admitting: Cardiology

## 2022-03-03 DIAGNOSIS — Z83438 Family history of other disorder of lipoprotein metabolism and other lipidemia: Secondary | ICD-10-CM

## 2022-03-03 DIAGNOSIS — I10 Essential (primary) hypertension: Secondary | ICD-10-CM

## 2022-03-05 ENCOUNTER — Telehealth: Payer: Self-pay | Admitting: Cardiology

## 2022-03-05 NOTE — Telephone Encounter (Signed)
Appointment has been made for pt. ?

## 2022-03-05 NOTE — Telephone Encounter (Signed)
Patient returning call for CT results. 

## 2022-03-05 NOTE — Telephone Encounter (Signed)
Left vm for pt to callback 

## 2022-03-19 ENCOUNTER — Ambulatory Visit: Payer: BC Managed Care – PPO | Admitting: Cardiology

## 2022-03-19 ENCOUNTER — Encounter: Payer: Self-pay | Admitting: Cardiology

## 2022-03-19 VITALS — BP 126/80 | HR 52 | Ht 79.2 in | Wt 259.0 lb

## 2022-03-19 DIAGNOSIS — R931 Abnormal findings on diagnostic imaging of heart and coronary circulation: Secondary | ICD-10-CM

## 2022-03-19 DIAGNOSIS — I480 Paroxysmal atrial fibrillation: Secondary | ICD-10-CM | POA: Diagnosis not present

## 2022-03-19 DIAGNOSIS — R079 Chest pain, unspecified: Secondary | ICD-10-CM

## 2022-03-19 DIAGNOSIS — E782 Mixed hyperlipidemia: Secondary | ICD-10-CM | POA: Diagnosis not present

## 2022-03-19 DIAGNOSIS — I10 Essential (primary) hypertension: Secondary | ICD-10-CM | POA: Diagnosis not present

## 2022-03-19 HISTORY — DX: Chest pain, unspecified: R07.9

## 2022-03-19 HISTORY — DX: Mixed hyperlipidemia: E78.2

## 2022-03-19 HISTORY — DX: Abnormal findings on diagnostic imaging of heart and coronary circulation: R93.1

## 2022-03-19 NOTE — Patient Instructions (Signed)
Medication Instructions:  Your physician has recommended you make the following change in your medication:   Start Crestor 10 mg daily once we review your labs.  Start 81 mg coated aspirin daily.  *If you need a refill on your cardiac medications before your next appointment, please call your pharmacy*   Lab Work: Your physician recommends that you have labs done in the office today. Your test included  basic metabolic panel, complete blood count, TSH,  vitamin D, hgb A1C, liver function and lipids.  [address] in [place]. They also close daily for lunch for 12-1.   [address] 2nd floor M-W 8-11:30 and 1-4:30 and Thursday and Friday 8-11:30.  If you have labs (blood work) drawn today and your tests are completely normal, you will receive your results only by: [place] (if you have MyChart) OR A paper copy in the mail If you have any lab test that is abnormal or we need to change your treatment, we will call you to review the results.   Testing/Procedures:      Stress Echocardiogram Information Sheet                                                      Instructions:    1. You may take your morning medications the morning of the test  2. Light breakfast.  3. Dress prepared to exercise.  4. DO NOT use ANY caffeine or tobacco products 3 hours before appointment.  5. Please bring all current prescription medications.    Follow-Up: At [facility], you and your health needs are our priority.  As part of our continuing mission to provide you with exceptional heart care, we have created designated Provider Care Teams.  These Care Teams include your primary Cardiologist (physician) and Advanced Practice Providers (APPs -  Physician Assistants and Nurse Practitioners) who all work together to provide you with the care you need, when you need it.  We recommend signing up for the patient portal called "MyChart".  Sign up  information is provided on this After Visit Summary.  MyChart is used to connect with patients for Virtual Visits (Telemedicine).  Patients are able to view lab/test results, encounter notes, upcoming appointments, etc.  Non-urgent messages can be sent to your provider as well.   To learn more about what you can do with MyChart, go to [email or web address].    Your next appointment:   6 month(s)  The format for your next appointment:   In Person  Provider:   Jyl Heinz, MD   Other Instructions Exercise Stress Echocardiogram An exercise stress echocardiogram is a test to check how well your heart is working. This test uses sound waves and a computer to make pictures of your heart. These pictures will be taken before and after you exercise. For this test, you will walk on a treadmill or ride a bicycle to make your heart beat faster. While you exercise, your heart will be checked with an electrocardiogram (ECG). Your blood pressure will also be checked. You may have this test if: You have chest pain or a heart problem. You had a heart attack or heart surgery not long ago. You have heart valve problems. You have a condition that causes narrowing of the blood vessels that supply your heart. You  have a high risk of heart disease and: You are starting a new exercise program. You need to have a big surgery. Tell a doctor about: Any allergies you have. All medicines you are taking. This includes vitamins, herbs, eye drops, creams, and over-the-counter medicines. Any problems you or family members have had with medicines that make you fall asleep (anesthetic medicines). Any surgeries you have had. Any blood disorders you have. Any medical conditions you have. Whether you are pregnant or may be pregnant. What are the risks? Generally, this is a safe test. However, problems may occur, including: Chest pain. Feeling dizzy or light-headed. Shortness of breath. Increased or irregular  heartbeat. Feeling like you may vomit (nausea) or vomiting. Heart attack. This is very rare. What happens before the test? Medicines Ask your doctor about changing or stopping your normal medicines. This is important if you take diabetes medicines or blood thinners. If you use an inhaler, bring it to the test. General instructions Wear comfortable clothes and walking shoes. Follow instructions from your doctor about what you cannot eat or drink before the test. Do not drink or eat anything that has caffeine in it. Stop having caffeine 24 hours before the test. Do not smoke or use products that contain nicotine or tobacco for 4 hours before the test. If you need help quitting, ask your doctor. What happens during the test?  You will take off your clothes from the waist up and put on a hospital gown. Electrodes or patches will be put on your chest. A blood pressure cuff will be put on your arm. Before you exercise, a computer will make a picture of your heart. To do this: You will lie down and a gel will be put on your chest. A wand will be moved over the gel. Sound waves from the wand will go to the computer to make the picture. Then, you will start to exercise. You may walk on a treadmill or pedal a bicycle. Your blood pressure and heart rhythm will be checked while you exercise. The exercise will get harder or faster. You will exercise until: Your heart reaches a certain level. You are too tired to go on. You cannot go on because of chest pain, weakness, or dizziness. You will lie down right away so another picture of your heart can be taken. The procedure may vary among doctors and hospitals. What can I expect after the test? After your test, it is common to have: Mild soreness. Mild tiredness. Your heart rate and blood pressure will be checked until they return to your normal levels. You should not have any new symptoms after this test. Follow these instructions at home: If  your doctor says that you can, you may: Eat what you normally eat. Do your normal activities. Take over-the-counter and prescription medicines only as told by your doctor. Keep all follow-up visits. It is up to you to get the results of your test. Ask how to get your results when they are ready. Contact a doctor if: You feel dizzy or light-headed. You have a fast or irregular heartbeat. You feel like you may vomit or you vomit. You have a headache. You feel short of breath. Get help right away if: You develop pain or pressure: In your chest. In your jaw or neck. Between your shoulders. That goes down your left arm. You faint. You have trouble breathing. These symptoms may be an emergency. Get medical help right away. Call your local emergency services (911 in  the [place] Do not wait to see if the symptoms will go away. Do not drive yourself to the hospital. Summary This is a test that checks how well your heart is working. Follow instructions about what you cannot eat or drink before the test. Ask your doctor if you should take your normal medicines before the test. Stop having caffeine 24 hours before the test. Do not smoke or use products with nicotine or tobacco in them for 4 hours before the test. During the test, your blood pressure and heart rhythm will be checked while you exercise. This information is not intended to replace advice given to you by your health care provider. Make sure you discuss any questions you have with your health care provider. Document Revised: 07/02/2021 Document Reviewed: 06/11/2020 Elsevier Patient Education  2022 [employer].

## 2022-03-19 NOTE — Progress Notes (Signed)
Cardiology Office Note:    Date:  03/19/2022   ID:  Mark Dorsey, DOB 28-Mar-1976, MRN [id number]  PCP:  Mackie Pai, PA-C  Cardiologist:  Jenean Lindau, MD   Referring MD: Mackie Pai, PA-C    ASSESSMENT:    1. Essential hypertension   2. PAF (paroxysmal atrial fibrillation) (HCC)   3. Elevated coronary artery calcium score   4. Mixed dyslipidemia    PLAN:    In order of problems listed above:  Elevated calcium score: Secondary prevention stressed with the patient.  Importance of compliance with diet and medication stressed any vocalized understanding.  He will need aggressive risk factor modification and is agreeable.  Diet exercise and weight reduction stressed.  Because of elevated calcium score we will do a stress echo. Chest pain: Patient has issues with chest pain occasionally.  Not related to exertion.  No radiation to the neck or to the arms.  Atypical in nature.  However in view of the fact that he might need medications for A-fib and to assess his elevated calcium score status will do an exercise stress echo as mentioned above. Paroxysmal atrial fibrillation:I discussed with the patient atrial fibrillation, disease process. Management and therapy including rate and rhythm control, anticoagulation benefits and potential risks were discussed extensively with the patient. Patient had multiple questions which were answered to patient's satisfaction.  His CHA2DS2-VASc score is at best 2 and I discussed with him issues such as anticoagulation and ablation.  He is not keen on anticoagulation at this time after explaining benefits and risks.  He wants to see our electrophysiology colleagues for assessment for ablation.  We will set up that appointment for him. Essential hypertension: Blood pressure stable and diet was emphasized.  Lifestyle modification urged. Sleep apnea: Patient has history of snoring and he has not completed his sleep study yet I encouraged him to do  so. Mixed dyslipidemia: Today we will get complete blood work including hemoglobin A1c and vitamin D.  We will initiate him on statin therapy in view of elevated calcium score. Patient will be seen in follow-up appointment in 6 months or earlier if the patient has any concerns.     Medication Adjustments/Labs and Tests Ordered: Current medicines are reviewed at length with the patient today.  Concerns regarding medicines are outlined above.  No orders of the defined types were placed in this encounter.  No orders of the defined types were placed in this encounter.    No chief complaint on file.    History of Present Illness:    Mark Dorsey is a 46 y.o. male.  Patient has past medical history of mixed dyslipidemia and paroxysmal atrial fibrillation.  We sent him for calcium scoring and it was significantly elevated for somebody of his age.  He denies any problems at this time and takes care of activities of daily living.  No chest pain orthopnea or PND.  At the time of my evaluation, the patient is alert awake oriented and in no distress.  Past Medical History:  Diagnosis Date   Anxiety    Cardiac murmur 01/12/2022   Essential hypertension 05/30/2015   Family history of diabetes mellitus (DM) 05/31/2015   Family history of hyperlipidemia 05/31/2015   Gastroesophageal reflux disease without esophagitis 05/31/2015   Hypertension    Left atrial enlargement 02/13/2022   Migraine without aura 05/31/2015   Obesity (BMI 35.0-39.9 without comorbidity) 01/12/2022   PAF (paroxysmal atrial fibrillation) ([place]) 02/13/2022   Palpitations 01/12/2022  Panic attacks 05/30/2015    Past Surgical History:  Procedure Laterality Date   NO PAST SURGERIES      Current Medications: Current Meds  Medication Sig   clonazePAM (KLONOPIN) 0.5 MG tablet TAKE ONE TABLET BY MOUTH TWICE A DAY AS NEEDED FOR ANXIETY   nebivolol (BYSTOLIC) 5 MG tablet Take 1 tablet (5 mg total) by mouth daily.    venlafaxine XR (EFFEXOR-XR) 37.5 MG 24 hr capsule TAKE ONE CAPSULE BY MOUTH EVERY MORNING WITH BREAKFAST     Allergies:   Penicillins   Social History   Socioeconomic History   Marital status: Married    Spouse name: Not on file   Number of children: Not on file   Years of education: Not on file   Highest education level: Not on file  Occupational History   Not on file  Tobacco Use   Smoking status: Former    Types: Cigarettes    Quit date: 2007    Years since quitting: 16.3   Smokeless tobacco: Never  Vaping Use   Vaping Use: Never used  Substance and Sexual Activity   Alcohol use: Yes    Comment: 3 beers a week.   Drug use: No   Sexual activity: Yes  Other Topics Concern   Not on file  Social History Narrative   Not on file   Social Determinants of Health   Financial Resource Strain: Not on file  Food Insecurity: Not on file  Transportation Needs: Not on file  Physical Activity: Not on file  Stress: Not on file  Social Connections: Not on file     Family History: The patient's family history includes Asthma in his paternal grandmother; Colon polyps in his father and paternal grandfather; Diabetes in his father; Hematuria in his father and paternal grandfather; Hypertension in his father, paternal grandmother, and sister; Nephrolithiasis in his mother.  ROS:   Please see the history of present illness.    All other systems reviewed and are negative.  EKGs/Labs/Other Studies Reviewed:    The following studies were reviewed today: 03/03/2022 16:38   ADDENDUM: Cardiovascular Disease Risk stratification   EXAM: Coronary Calcium Score   TECHNIQUE: A gated, non-contrast computed tomography scan of the heart was performed using 55m slice thickness. Axial images were analyzed on a dedicated workstation. Calcium scoring of the coronary arteries was performed using the Agatston method.   FINDINGS: Coronary arteries: Normal origins.   Coronary Calcium  Score:   Left main: 0   Left anterior descending artery: 126   Left circumflex artery: 13.4   Right coronary artery: 671   Total: 810   Percentile: 99   Pericardium: Normal.   Ascending Aorta: Normal caliber.   Non-cardiac: [facility]   IMPRESSION: Coronary calcium score of 810. This was 97percentile for age-, race-, and sex-matched controls.   RECOMMENDATIONS: Coronary artery calcium (CAC) score is a strong predictor of incident coronary heart disease (CHD) and provides predictive information beyond traditional risk factors. CAC scoring is reasonable to use in the decision to withhold, postpone, or initiate statin therapy in intermediate-risk or selected borderline-risk asymptomatic adults (age 44-75years and LDL-C >=70 to <190 mg/dL) who do not have diabetes or established atherosclerotic cardiovascular disease (ASCVD).* In intermediate-risk (10-year ASCVD risk >=7.5% to <20%) adults or selected borderline-risk (10-year ASCVD risk >=5% to <7.5%) adults in whom a CAC score is measured for the purpose of making a treatment decision the following recommendations have been made:  If CAC=0, it is reasonable to withhold statin therapy and reassess in 5 to 10 years, as long as higher risk conditions are absent (diabetes mellitus, family history of premature CHD in first degree relatives (males <55 years; females <65 years), cigarette smoking, or LDL >=190 mg/dL).   If CAC is 1 to 99, it is reasonable to initiate statin therapy for patients >=76 years of age.   If CAC is >=100 or >=75th percentile, it is reasonable to initiate statin therapy at any age.   Cardiology referral should be considered for patients with CAC scores >=400 or >=75th percentile.   *2018 AHA/ACC/AACVPR/AAPA/ABC/ACPM/ADA/AGS/APhA/ASPC/NLA/PCNA Guideline on the Management of Blood Cholesterol: A Report of the American College of Cardiology/American Heart  Association Task Force on Clinical Practice Guidelines. J Am Coll Cardiol. 2019;73(24):3168-3209.   Berniece Salines, DO   The noncardiac portion of this study will be interpreted in separate report by the radiologist.     Electronically Signed   By: Berniece Salines D.O.   On: 03/03/2022 16:38   Recent Labs: 12/18/2021: TSH 0.73  Recent Lipid Panel    Component Value Date/Time   CHOL 173 10/11/2018 0705   TRIG 196.0 (H) 10/11/2018 0705   HDL 30.20 (L) 10/11/2018 0705   CHOLHDL 6 10/11/2018 0705   VLDL 39.2 10/11/2018 0705   LDLCALC 103 (H) 10/11/2018 0705   LDLDIRECT 98.0 06/30/2017 0729    Physical Exam:    VS:  BP 126/80   Pulse (!) 52   Ht 6' 7.2" (2.012 m)   Wt 259 lb (117.5 kg)   SpO2 97%   BMI 29.03 kg/m     Wt Readings from Last 3 Encounters:  03/19/22 259 lb (117.5 kg)  02/13/22 263 lb 0.6 oz (119.3 kg)  01/12/22 260 lb 1.9 oz (118 kg)     GEN: Patient is in no acute distress HEENT: Normal NECK: No JVD; No carotid bruits LYMPHATICS: No lymphadenopathy CARDIAC: Hear sounds regular, 2/6 systolic murmur at the apex. RESPIRATORY:  Clear to auscultation without rales, wheezing or rhonchi  ABDOMEN: Soft, non-tender, non-distended MUSCULOSKELETAL:  No edema; No deformity  SKIN: Warm and dry NEUROLOGIC:  Alert and oriented x 3 PSYCHIATRIC:  Normal affect   Signed, Jenean Lindau, MD  03/19/2022 8:27 AM    [place]

## 2022-03-20 LAB — CBC WITH DIFFERENTIAL/PLATELET
Basophils Absolute: 0.1 10*3/uL (ref 0.0–0.2)
Basos: 1 %
EOS (ABSOLUTE): 0.3 10*3/uL (ref 0.0–0.4)
Eos: 6 %
Hematocrit: 46.3 % (ref 37.5–51.0)
Hemoglobin: 15.5 g/dL (ref 13.0–17.7)
Immature Grans (Abs): 0 10*3/uL (ref 0.0–0.1)
Immature Granulocytes: 0 %
Lymphocytes Absolute: 1.6 10*3/uL (ref 0.7–3.1)
Lymphs: 31 %
MCH: 29.4 pg (ref 26.6–33.0)
MCHC: 33.5 g/dL (ref 31.5–35.7)
MCV: 88 fL (ref 79–97)
Monocytes Absolute: 0.4 10*3/uL (ref 0.1–0.9)
Monocytes: 9 %
Neutrophils Absolute: 2.7 10*3/uL (ref 1.4–7.0)
Neutrophils: 53 %
Platelets: 242 10*3/uL (ref 150–450)
RBC: 5.27 x10E6/uL (ref 4.14–5.80)
RDW: 13.1 % (ref 11.6–15.4)
WBC: 5.1 10*3/uL (ref 3.4–10.8)

## 2022-03-20 LAB — LIPID PANEL
Chol/HDL Ratio: 5.4 ratio — ABNORMAL HIGH (ref 0.0–5.0)
Cholesterol, Total: 166 mg/dL (ref 100–199)
HDL: 31 mg/dL — ABNORMAL LOW (ref >39)
LDL Chol Calc (NIH): 111 mg/dL — ABNORMAL HIGH (ref 0–99)
Triglycerides: 135 mg/dL (ref 0–149)
VLDL Cholesterol Cal: 24 mg/dL (ref 5–40)

## 2022-03-20 LAB — HEPATIC FUNCTION PANEL
ALT: 18 IU/L (ref 0–44)
AST: 16 IU/L (ref 0–40)
Albumin: 4.6 g/dL (ref 4.0–5.0)
Alkaline Phosphatase: 63 IU/L (ref 44–121)
Bilirubin Total: 0.4 mg/dL (ref 0.0–1.2)
Bilirubin, Direct: 0.11 mg/dL (ref 0.00–0.40)
Total Protein: 6.8 g/dL (ref 6.0–8.5)

## 2022-03-20 LAB — BASIC METABOLIC PANEL
BUN/Creatinine Ratio: 12 (ref 9–20)
BUN: 12 mg/dL (ref 6–24)
CO2: 25 mmol/L (ref 20–29)
Calcium: 9.3 mg/dL (ref 8.7–10.2)
Chloride: 105 mmol/L (ref 96–106)
Creatinine, Ser: 0.98 mg/dL (ref 0.76–1.27)
Glucose: 99 mg/dL (ref 70–99)
Potassium: 4.8 mmol/L (ref 3.5–5.2)
Sodium: 143 mmol/L (ref 134–144)
eGFR: 98 mL/min/{1.73_m2} (ref >59)

## 2022-03-20 LAB — TSH: TSH: 1.12 u[IU]/mL (ref 0.450–4.500)

## 2022-03-20 LAB — HEMOGLOBIN A1C
Est. average glucose Bld gHb Est-mCnc: 114 mg/dL
Hgb A1c MFr Bld: 5.6 % (ref 4.8–5.6)

## 2022-03-20 LAB — VITAMIN D 25 HYDROXY (VIT D DEFICIENCY, FRACTURES): Vit D, 25-Hydroxy: 12.3 ng/mL — ABNORMAL LOW (ref 30.0–100.0)

## 2022-03-23 ENCOUNTER — Encounter: Payer: Self-pay | Admitting: Cardiology

## 2022-03-23 ENCOUNTER — Telehealth: Payer: Self-pay

## 2022-03-23 ENCOUNTER — Other Ambulatory Visit: Payer: Self-pay | Admitting: Medical

## 2022-03-23 ENCOUNTER — Encounter: Payer: Self-pay | Admitting: Medical

## 2022-03-23 DIAGNOSIS — E782 Mixed hyperlipidemia: Secondary | ICD-10-CM

## 2022-03-23 DIAGNOSIS — R931 Abnormal findings on diagnostic imaging of heart and coronary circulation: Secondary | ICD-10-CM

## 2022-03-23 MED ORDER — ROSUVASTATIN CALCIUM 10 MG PO TABS: 10.0000 mg | ORAL_TABLET | Freq: Every day | ORAL | 3 refills | Status: DC

## 2022-03-23 NOTE — Telephone Encounter (Signed)
Results reviewed with pt as per Dr. Revankar's note.  Pt verbalized understanding and had no additional questions. Routed to PCP.  

## 2022-03-23 NOTE — Telephone Encounter (Signed)
-----   Message from Jenean Lindau, MD sent at 03/20/2022  1:42 PM EDT ----- Rosuvastatin 10 mg daily and liver lipid check in 6 weeks.  Talk to primary care ASAP about vitamin D supplementation.  Copy primary care Jenean Lindau, MD 03/20/2022 1:42 PM

## 2022-03-23 NOTE — Telephone Encounter (Signed)
Requesting:klonopin 0.5 mg Contract:unknown LDK:[id number] Last Visit:12/31/21 Next Visit:unknown Last Refill:02/05/22  Please Advise   Rx refill sent to pharmacy.  Mackie Pai, PA-C

## 2022-03-31 ENCOUNTER — Ambulatory Visit: Payer: BC Managed Care – PPO | Admitting: Cardiology

## 2022-03-31 ENCOUNTER — Encounter: Payer: Self-pay | Admitting: Cardiology

## 2022-03-31 VITALS — BP 112/70 | HR 92 | Ht 72.0 in | Wt 259.0 lb

## 2022-03-31 DIAGNOSIS — I2510 Atherosclerotic heart disease of native coronary artery without angina pectoris: Secondary | ICD-10-CM | POA: Diagnosis not present

## 2022-03-31 DIAGNOSIS — I480 Paroxysmal atrial fibrillation: Secondary | ICD-10-CM

## 2022-03-31 DIAGNOSIS — I10 Essential (primary) hypertension: Secondary | ICD-10-CM | POA: Diagnosis not present

## 2022-03-31 MED ORDER — APIXABAN 5 MG PO TABS: 5.0000 mg | ORAL_TABLET | Freq: Two times a day (BID) | ORAL | 3 refills | Status: DC

## 2022-03-31 NOTE — Progress Notes (Signed)
Electrophysiology Office Note:    Date:  03/31/2022   ID:  Mark Dorsey, DOB 10-19-1976, MRN [id number]  PCP:  Mackie Pai, PA-C  [facility] Cardiologist:  None  [facility] Electrophysiologist:  Vickie Epley, MD   Referring MD: Jenean Lindau, MD   Chief Complaint: New patient consult for Afib  History of Present Illness:    Mark Dorsey is a 46 y.o. male who presents for an evaluation of atrial fibrillation at the request of Dr. Geraldo Pitter. Their medical history includes paroxysmal atrial fibrillation, left atrial enlargement, cardiac murmur, hypertension, GERD, obesity, and anxiety attacks.  On 03/19/2022 he saw Dr. Geraldo Pitter and reported occasional non-exertional chest pain. He had a coronary CT on 03/03/2022 revealing a coronary calcium score of 810 (99th percentile), so he was started on rosuvastatin 10 mg daily and 81 mg ASA. A stress Echo was ordered but not completed. It was noted his CHA2DS2-VASc score was at best 2. He was not interested in starting anticoagulation, but wished to be referred to EP for assessment for ablation.  He initially had an alert of Afib from his FitBit. He denies being able to tell that he was in Afib.   He reports that he recently had episodes of cold chills of unknown etiology. It was felt to be related to his anxiety.  In his family, he believes his father has atrial fibrillation. His paternal grandmother had a stroke. His maternal grandparents had heart attacks. He also notes aneurysms in his family as well.  Since last year he is down about 40 lbs. He has mostly been working on dietary changes such as intermittent fasting. Also he is generally following a more vegetarian diet. At most his alcohol consumption may be 1-2 beers a week.  He is a former smoker. Quit about 9 years ago.  He endorses snoring, worse on some nights.  He denies any chest pain, shortness of breath, or peripheral edema. No lightheadedness, headaches,  syncope, orthopnea, or PND.      Past Medical History:  Diagnosis Date   Anxiety    Cardiac murmur 01/12/2022   Essential hypertension 05/30/2015   Family history of diabetes mellitus (DM) 05/31/2015   Family history of hyperlipidemia 05/31/2015   Gastroesophageal reflux disease without esophagitis 05/31/2015   Hypertension    Left atrial enlargement 02/13/2022   Migraine without aura 05/31/2015   Obesity (BMI 35.0-39.9 without comorbidity) 01/12/2022   PAF (paroxysmal atrial fibrillation) ([place]) 02/13/2022   Palpitations 01/12/2022   Panic attacks 05/30/2015    Past Surgical History:  Procedure Laterality Date   NO PAST SURGERIES      Current Medications: Current Meds  Medication Sig   clonazePAM (KLONOPIN) 0.5 MG tablet TAKE ONE TABLET BY MOUTH TWICE A DAY AS NEEDED ANXIETY   nebivolol (BYSTOLIC) 5 MG tablet Take 1 tablet (5 mg total) by mouth daily.   rosuvastatin (CRESTOR) 10 MG tablet Take 1 tablet (10 mg total) by mouth daily.   venlafaxine XR (EFFEXOR-XR) 37.5 MG 24 hr capsule TAKE ONE CAPSULE BY MOUTH EVERY MORNING WITH BREAKFAST     Allergies:   Penicillins   Social History   Socioeconomic History   Marital status: Married    Spouse name: Not on file   Number of children: Not on file   Years of education: Not on file   Highest education level: Not on file  Occupational History   Not on file  Tobacco Use   Smoking status: Former    Types:  Cigarettes    Quit date: 2007    Years since quitting: 16.4   Smokeless tobacco: Never  Vaping Use   Vaping Use: Never used  Substance and Sexual Activity   Alcohol use: Yes    Comment: 3 beers a week.   Drug use: No   Sexual activity: Yes  Other Topics Concern   Not on file  Social History Narrative   Not on file   Social Determinants of Health   Financial Resource Strain: Not on file  Food Insecurity: Not on file  Transportation Needs: Not on file  Physical Activity: Not on file  Stress: Not on file   Social Connections: Not on file     Family History: The patient's family history includes Asthma in his paternal grandmother; Colon polyps in his father and paternal grandfather; Diabetes in his father; Hematuria in his father and paternal grandfather; Hypertension in his father, paternal grandmother, and sister; Nephrolithiasis in his mother.  ROS:   Please see the history of present illness.    (+) Anxiety (+) Chills (+) Snoring All other systems reviewed and are negative.  EKGs/Labs/Other Studies Reviewed:    The following studies were reviewed today:  03/03/2022  Coronary Calcium Score FINDINGS: Coronary arteries: Normal origins.   Coronary Calcium Score:   Left main: 0   Left anterior descending artery: 126   Left circumflex artery: 13.4   Right coronary artery: 671   Total: 810   Percentile: 99   Pericardium: Normal.   Ascending Aorta: Normal caliber.   Non-cardiac: See separate report from [facility].   IMPRESSION: Coronary calcium score of 810. This was 69 percentile for age-, race-, and sex-matched controls.  01/2022  Monitor  Patch Wear Time:  11 days and 1 hours (2023-03-13T16:10:59-0400 to 2023-03-24T17:35:30-0400)   Patient had a min HR of 44 bpm, max HR of 245 bpm, and avg HR of 85 bpm.    Predominant underlying rhythm was Sinus Rhythm.   22975 Supraventricular Tachycardia runs occurred, the run with the fastest interval lasting 9 beats with a max rate of 245 bpm, the longest lasting 1 min 53 secs with an avg rate of 195 bpm. Atrial Fibrillation occurred (3% burden), ranging from 71-226 bpm (avg of 140 bpm), the longest lasting 8 hours 31 mins with an avg rate of 140 bpm. Supraventricular Tachycardia and Atrial  Fibrillation were detected within +/- 45 seconds of symptomatic patient event(s). Isolated SVEs were occasional (4.9%, 70155), SVE Couplets were frequent (7.8%, 56085), and SVE Triplets were frequent (9.4%, 44796).    Isolated VEs  were rare (<1.0%), and no VE Couplets or VE Triplets were present. Ventricular Trigeminy was present. MD notification criteria for Rapid Atrial Fibrillation and Supraventricular Tachycardia met - report posted prior to notification per account request (TB).   Impression: Abnormal event monitor with paroxysmal atrial fibrillation with elevated ventricular rate and supraventricular tachycardia as mentioned above.  01/15/2022  Echo  1. Left ventricular ejection fraction, by estimation, is 50 to 55%. The  left ventricle has low normal function. The left ventricle has no regional  wall motion abnormalities. Left ventricular diastolic parameters were  normal.   2. Right ventricular systolic function is normal. The right ventricular  size is normal.   3. Left atrial size was moderately dilated.    EKG:   EKG is personally reviewed.  03/31/2022: EKG was not ordered.    Recent Labs: 03/19/2022: ALT 18; BUN 12; Creatinine, Ser 0.98; Hemoglobin 15.5; Platelets 242; Potassium 4.8;  Sodium 143; TSH 1.120   Recent Lipid Panel    Component Value Date/Time   CHOL 166 03/19/2022 0852   TRIG 135 03/19/2022 0852   HDL 31 (L) 03/19/2022 0852   CHOLHDL 5.4 (H) 03/19/2022 0852   CHOLHDL 6 10/11/2018 0705   VLDL 39.2 10/11/2018 0705   LDLCALC 111 (H) 03/19/2022 0852   LDLDIRECT 98.0 06/30/2017 0729    Physical Exam:    VS:  BP 112/70   Pulse 92   Ht 6' (1.829 m)   Wt 259 lb (117.5 kg)   SpO2 93%   BMI 35.13 kg/m     Wt Readings from Last 3 Encounters:  03/31/22 259 lb (117.5 kg)  03/19/22 259 lb (117.5 kg)  02/13/22 263 lb 0.6 oz (119.3 kg)     GEN: Well nourished, well developed in no acute distress HEENT: Normal NECK: No JVD; No carotid bruits LYMPHATICS: No lymphadenopathy CARDIAC: RRR, no murmurs, rubs, gallops RESPIRATORY:  Clear to auscultation without rales, wheezing or rhonchi  ABDOMEN: Soft, non-tender, non-distended MUSCULOSKELETAL:  No edema; No deformity  SKIN: Warm and  dry NEUROLOGIC:  Alert and oriented x 3 PSYCHIATRIC:  Normal affect       ASSESSMENT:    1. PAF (paroxysmal atrial fibrillation) ([place])   2. Primary hypertension   3. Coronary artery disease involving native coronary artery of native heart without angina pectoris    PLAN:    In order of problems listed above:  #Paroxysmal atrial fibrillation #Paroxysmal atrial tachycardia #PACs Patient has a high burden of PACs and by monitor is having very frequent salvos of atrial fibrillation.  He takes aspirin 81 mg by mouth daily but given a CHA2DS2-VASc of 2, he should be on an anticoagulant for appropriate stroke risk mitigation.  I discussed this at length with the patient during today's visit and he is agreeable to starting Eliquis.  We will start Eliquis 5 mg by mouth twice daily.  I discussed the treatment strategies for his atrial fibrillation during today's clinic appointment including conservative management, antiarrhythmic drug initiation or catheter ablation.  Given his young age, I would favor catheter ablation to avoid long-term exposure antiarrhythmic drugs.  I discussed the ablation procedure in detail during today's visit include the risk, recovery and likelihood of success.  He would like to proceed with scheduling.  This will need to be performed after his stress test is done to make sure there is no evidence of obstructive coronary disease.  Discussed treatment options today for his AF including antiarrhythmic drug therapy and ablation. Discussed risks, recovery and likelihood of success. Discussed potential need for repeat ablation procedures and antiarrhythmic drugs after an initial ablation. They wish to proceed with scheduling.  Risk, benefits, and alternatives to EP study and radiofrequency ablation for afib were also discussed in detail today. These risks include but are not limited to stroke, bleeding, vascular damage, tamponade, perforation, damage to the esophagus, lungs, and  other structures, pulmonary vein stenosis, worsening renal function, and death. The patient understands these risk and wishes to proceed.  We will therefore proceed with catheter ablation at the next available time.  Carto, ICE, anesthesia are requested for the procedure.  Will also obtain CT PV protocol prior to the procedure to exclude LAA thrombus and further evaluate atrial anatomy.   #Coronary artery disease Continue rosuvastatin.  Continue aspirin.  Stress test planned.  #Hypertension Controlled.  Continue Bystolic.  Recommend checking blood pressures at home 1-2 times per week and recording these  values.  He should bring these recordings to his primary care physician and Dr. Geraldo Pitter.   Total time spent with patient today 60 minutes. This includes reviewing records, evaluating the patient and coordinating care.  Medication Adjustments/Labs and Tests Ordered: Current medicines are reviewed at length with the patient today.  Concerns regarding medicines are outlined above.  No orders of the defined types were placed in this encounter.  No orders of the defined types were placed in this encounter.   I,Mathew Stumpf,acting as a Education administrator for Vickie Epley, MD.,have documented all relevant documentation on the behalf of Vickie Epley, MD,as directed by  Vickie Epley, MD while in the presence of Vickie Epley, MD.  I, Vickie Epley, MD, have reviewed all documentation for this visit. The documentation on 03/31/22 for the exam, diagnosis, procedures, and orders are all accurate and complete.   Signed, Hilton Cork. Quentin Ore, MD, [facility], [facility] 03/31/2022 11:38 AM    Electrophysiology [facility]

## 2022-03-31 NOTE — Patient Instructions (Addendum)
Medication Instructions:  Start Eliquis 5 mg two times a day Your physician recommends that you continue on your current medications as directed. Please refer to the Current Medication list given to you today. *If you need a refill on your cardiac medications before your next appointment, please call your pharmacy*  Lab Work: Aug 7 anytime from 8 am to 4 pm. No fasting needed.  If you have labs (blood work) drawn today and your tests are completely normal, you will receive your results only by: [place] (if you have MyChart) OR A paper copy in the mail If you have any lab test that is abnormal or we need to change your treatment, we will call you to review the results.  Testing/Procedures: Your physician has requested that you have cardiac CT. Cardiac computed tomography (CT) is a painless test that uses an x-ray machine to take clear, detailed pictures of your heart. For further information please visit [email or web address]. Please follow instruction sheet as given.   Your physician has recommended that you have an ablation. Catheter ablation is a medical procedure used to treat some cardiac arrhythmias (irregular heartbeats). During catheter ablation, a long, thin, flexible tube is put into a blood vessel in your groin (upper thigh), or neck. This tube is called an ablation catheter. It is then guided to your heart through the blood vessel. Radio frequency waves destroy small areas of heart tissue where abnormal heartbeats may cause an arrhythmia to start. Please see the instruction sheet given to you today.   Follow-Up: At [facility], you and your health needs are our priority.  As part of our continuing mission to provide you with exceptional heart care, we have created designated Provider Care Teams.  These Care Teams include your primary Cardiologist (physician) and Advanced Practice Providers (APPs -  Physician Assistants and Nurse Practitioners) who all work together to provide  you with the care you need, when you need it.  Your physician wants you to follow-up in: Ablation date picked Aug 28. Otila Kluver RN will call you with instructions for CT scan and procedure.   We recommend signing up for the patient portal called "MyChart".  Sign up information is provided on this After Visit Summary.  MyChart is used to connect with patients for Virtual Visits (Telemedicine).  Patients are able to view lab/test results, encounter notes, upcoming appointments, etc.  Non-urgent messages can be sent to your provider as well.   To learn more about what you can do with MyChart, go to [email or web address].    Any Other Special Instructions Will Be Listed Below (If Applicable).  Cardiac Ablation Cardiac ablation is a procedure to destroy (ablate) some heart tissue that is sending bad signals. These bad signals cause problems in heart rhythm. The heart has many areas that make these signals. If there are problems in these areas, they can make the heart beat in a way that is not normal. Destroying some tissues can help make the heart rhythm normal. Tell your doctor about: Any allergies you have. All medicines you are taking. These include vitamins, herbs, eye drops, creams, and over-the-counter medicines. Any problems you or family members have had with medicines that make you fall asleep (anesthetics). Any blood disorders you have. Any surgeries you have had. Any medical conditions you have, such as kidney failure. Whether you are pregnant or may be pregnant. What are the risks? This is a safe procedure. But problems may occur, including: Infection. Bruising and bleeding. Bleeding into the chest. Stroke  or blood clots. Damage to nearby areas of your body. Allergies to medicines or dyes. The need for a pacemaker if the normal system is damaged. Failure of the procedure to treat the problem. What happens before the procedure? Medicines Ask your doctor about: Changing or stopping  your normal medicines. This is important. Taking aspirin and ibuprofen. Do not take these medicines unless your doctor tells you to take them. Taking other medicines, vitamins, herbs, and supplements. General instructions Follow instructions from your doctor about what you cannot eat or drink. Plan to have someone take you home from the hospital or clinic. If you will be going home right after the procedure, plan to have someone with you for 24 hours. Ask your doctor what steps will be taken to prevent infection. What happens during the procedure?  An IV tube will be put into one of your veins. You will be given a medicine to help you relax. The skin on your neck or groin will be numbed. A cut (incision) will be made in your neck or groin. A needle will be put through your cut and into a large vein. A tube (catheter) will be put into the needle. The tube will be moved to your heart. Dye may be put through the tube. This helps your doctor see your heart. Small devices (electrodes) on the tube will send out signals. A type of energy will be used to destroy some heart tissue. The tube will be taken out. Pressure will be held on your cut. This helps stop bleeding. A bandage will be put over your cut. The exact procedure may vary among doctors and hospitals. What happens after the procedure? You will be watched until you leave the hospital or clinic. This includes checking your heart rate, breathing rate, oxygen, and blood pressure. Your cut will be watched for bleeding. You will need to lie still for a few hours. Do not drive for 24 hours or as long as your doctor tells you. Summary Cardiac ablation is a procedure to destroy some heart tissue. This is done to treat heart rhythm problems. Tell your doctor about any medical conditions you may have. Tell him or her about all medicines you are taking to treat them. This is a safe procedure. But problems may occur. These include infection,  bruising, bleeding, and damage to nearby areas of your body. Follow what your doctor tells you about food and drink. You may also be told to change or stop some of your medicines. After the procedure, do not drive for 24 hours or as long as your doctor tells you. This information is not intended to replace advice given to you by your health care provider. Make sure you discuss any questions you have with your health care provider. Document Revised: 09/21/2019 Document Reviewed: 09/21/2019 Elsevier Patient Education  [place].

## 2022-04-07 NOTE — Telephone Encounter (Signed)
Left vm for pt to callback regarding watchpat sleep study.

## 2022-04-13 ENCOUNTER — Telehealth (HOSPITAL_COMMUNITY): Payer: Self-pay | Admitting: *Deleted

## 2022-04-13 NOTE — Telephone Encounter (Signed)
Left message on voicemail per DPR in reference to upcoming appointment scheduled on 04/20/2022 at 1:50 with detailed instructions given per Stress Test Requisition Sheet for the test. LM to arrive 30 minutes early, and that it is imperative to arrive on time for appointment to keep from having the test rescheduled. If you need to cancel or reschedule your appointment, please call the office within 24 hours of your appointment. Failure to do so may result in a cancellation of your appointment, and a $50 no show fee. Phone number given for call back for any questions. Mark Dorsey

## 2022-04-20 ENCOUNTER — Other Ambulatory Visit (HOSPITAL_COMMUNITY): Payer: BC Managed Care – PPO

## 2022-04-20 ENCOUNTER — Ambulatory Visit (HOSPITAL_COMMUNITY): Payer: BC Managed Care – PPO

## 2022-05-06 ENCOUNTER — Other Ambulatory Visit: Payer: Self-pay | Admitting: Medical

## 2022-05-07 ENCOUNTER — Other Ambulatory Visit: Payer: Self-pay | Admitting: Medical

## 2022-05-13 ENCOUNTER — Telehealth (HOSPITAL_COMMUNITY): Payer: Self-pay | Admitting: *Deleted

## 2022-05-13 NOTE — Telephone Encounter (Signed)
Left message with instructions for upcoming stress echo. Patient told to arrive by 1:15.  Mark Dorsey

## 2022-05-20 ENCOUNTER — Encounter (HOSPITAL_COMMUNITY): Payer: Self-pay

## 2022-05-20 ENCOUNTER — Encounter (HOSPITAL_COMMUNITY): Payer: Self-pay | Admitting: Cardiology

## 2022-05-20 ENCOUNTER — Ambulatory Visit (HOSPITAL_COMMUNITY): Payer: BC Managed Care – PPO | Attending: Internal Medicine

## 2022-05-20 ENCOUNTER — Ambulatory Visit (HOSPITAL_COMMUNITY): Payer: BC Managed Care – PPO

## 2022-05-20 NOTE — Progress Notes (Signed)
Verified appointment "no show" status with C. Charlestine Massed and B. [place] at 13:55.

## 2022-05-21 ENCOUNTER — Telehealth: Payer: Self-pay

## 2022-05-21 DIAGNOSIS — I4891 Unspecified atrial fibrillation: Secondary | ICD-10-CM

## 2022-05-21 MED ORDER — METOPROLOL TARTRATE 50 MG PO TABS: ORAL_TABLET | ORAL | 0 refills | Status: DC

## 2022-05-21 NOTE — Telephone Encounter (Signed)
Pt scheduled for afib ablation 8/28  Will get lab work 8/7  Instruction letters completed and mailed.  Work up complete.

## 2022-05-31 ENCOUNTER — Other Ambulatory Visit: Payer: Self-pay | Admitting: Medical

## 2022-06-01 NOTE — Telephone Encounter (Signed)
Requesting: klonopin  Contract:n/a UDS:n/a Last Visit:12/31/21 Next Visit:n/a Last Refill:03/24/22  Please Advise

## 2022-06-08 ENCOUNTER — Other Ambulatory Visit: Payer: BC Managed Care – PPO

## 2022-06-22 ENCOUNTER — Telehealth (HOSPITAL_COMMUNITY): Payer: Self-pay | Admitting: Emergency Medicine

## 2022-06-22 NOTE — Telephone Encounter (Signed)
Attempted to call patient regarding upcoming cardiac CT appointment. °Left message on voicemail with name and callback number °Alayssa Flinchum RN Navigator Cardiac Imaging °[facility] °[phone] Office °[phone] Cell ° °

## 2022-06-23 ENCOUNTER — Ambulatory Visit (HOSPITAL_BASED_OUTPATIENT_CLINIC_OR_DEPARTMENT_OTHER): Admission: RE | Admit: 2022-06-23 | Payer: BC Managed Care – PPO | Source: Ambulatory Visit

## 2022-06-24 ENCOUNTER — Telehealth: Payer: Self-pay | Admitting: *Deleted

## 2022-06-24 NOTE — Telephone Encounter (Addendum)
Left a detailed message (DPR) patient missed his preop CT scan yesterday 8/22, notified he can not have his ablation next week with out this CT. Advised to call the office ASAP.

## 2022-06-25 ENCOUNTER — Ambulatory Visit (HOSPITAL_BASED_OUTPATIENT_CLINIC_OR_DEPARTMENT_OTHER): Admission: RE | Admit: 2022-06-25 | Payer: BC Managed Care – PPO | Source: Ambulatory Visit

## 2022-06-25 NOTE — Telephone Encounter (Signed)
Left a detailed message (DPR) patient is missing all of his preop appointments for his procedure in 4 days.  Advised to call the office ASAP.

## 2022-06-26 ENCOUNTER — Telehealth: Payer: Self-pay | Admitting: Cardiology

## 2022-06-26 NOTE — Telephone Encounter (Signed)
Patient called to get his procedure for 8/28 pushed back, so he can do the echo and treadmill test. A new order for the echo and treadmill test ned to be put in for the patient. Please advise

## 2022-06-29 ENCOUNTER — Encounter (HOSPITAL_COMMUNITY): Admission: RE | Payer: BC Managed Care – PPO | Source: Home / Self Care

## 2022-06-29 ENCOUNTER — Ambulatory Visit (HOSPITAL_COMMUNITY): Admission: RE | Admit: 2022-06-29 | Payer: BC Managed Care – PPO | Source: Home / Self Care | Admitting: Cardiology

## 2022-06-29 SURGERY — ATRIAL FIBRILLATION ABLATION
Anesthesia: General

## 2022-06-29 NOTE — Telephone Encounter (Signed)
Already canceled procedure with Dr. Quentin Ore that was schedule for today because the patient no showed all of his pre op evaluation appointments.   The testing the is requesting to reschedule was ordered from Dr. Geraldo Pitter. Will forward to that office to follow up.

## 2022-06-29 NOTE — Telephone Encounter (Signed)
Per MD will see PRN. Follow up with Dr. Geraldo Pitter.

## 2022-06-29 NOTE — Telephone Encounter (Signed)
FYI nothing to do

## 2022-07-27 ENCOUNTER — Ambulatory Visit (HOSPITAL_COMMUNITY): Payer: BC Managed Care – PPO | Admitting: Physician Assistant

## 2022-09-28 ENCOUNTER — Ambulatory Visit: Payer: BC Managed Care – PPO | Admitting: Cardiology

## 2022-10-30 ENCOUNTER — Other Ambulatory Visit: Payer: Self-pay | Admitting: Medical

## 2022-12-18 ENCOUNTER — Encounter: Payer: Self-pay | Admitting: *Deleted

## 2022-12-18 DIAGNOSIS — Z006 Encounter for examination for normal comparison and control in clinical research program: Secondary | ICD-10-CM

## 2022-12-18 NOTE — Research (Unsigned)
Spoke with Mark Dorsey about V1P research. States ok to send him information about V1P. Emailed a copy of the consent for him to read over. Encouraged him to call with any questions.

## 2022-12-24 ENCOUNTER — Encounter: Payer: Self-pay | Admitting: *Deleted

## 2022-12-24 DIAGNOSIS — Z006 Encounter for examination for normal comparison and control in clinical research program: Secondary | ICD-10-CM

## 2022-12-24 NOTE — Research (Signed)
Message left with Mr Mark Dorsey to see if he has any questions about V1P. Encouraged him to call back.

## 2023-02-01 ENCOUNTER — Other Ambulatory Visit: Payer: Self-pay | Admitting: Medical

## 2023-02-02 ENCOUNTER — Encounter: Payer: Self-pay | Admitting: *Deleted

## 2023-02-02 DIAGNOSIS — Z006 Encounter for examination for normal comparison and control in clinical research program: Secondary | ICD-10-CM

## 2023-02-02 NOTE — Research (Signed)
Message left  to see if he has any questions about V1P or if he would like to come in for a screening visit. Encouraged him to call.

## 2023-02-13 ENCOUNTER — Other Ambulatory Visit: Payer: Self-pay | Admitting: Cardiology

## 2023-02-13 DIAGNOSIS — I480 Paroxysmal atrial fibrillation: Secondary | ICD-10-CM

## 2023-02-13 DIAGNOSIS — I10 Essential (primary) hypertension: Secondary | ICD-10-CM

## 2023-03-03 ENCOUNTER — Telehealth: Payer: Self-pay | Admitting: Cardiology

## 2023-03-03 MED ORDER — ROSUVASTATIN CALCIUM 10 MG PO TABS
10.0000 mg | ORAL_TABLET | Freq: Every day | ORAL | 1 refills | Status: DC
Start: 2023-03-03 — End: 2023-07-12

## 2023-03-03 NOTE — Telephone Encounter (Signed)
*  STAT* If patient is at the pharmacy, call can be transferred to refill team.   1. Which medications need to be refilled? (please list name of each medication and dose if known) rosuvastatin (CRESTOR) 10 MG tablet   2. Which pharmacy/location (including street and city if local pharmacy) is medication to be sent to?  [facility] [address] - [place], [place] - [address]    3. Do they need a 30 day or 90 day supply? 90 day    Pt is completely out of medication.

## 2023-03-03 NOTE — Telephone Encounter (Signed)
Medication sent.

## 2023-03-15 ENCOUNTER — Other Ambulatory Visit: Payer: Self-pay | Admitting: Cardiology

## 2023-03-15 DIAGNOSIS — I480 Paroxysmal atrial fibrillation: Secondary | ICD-10-CM

## 2023-03-15 DIAGNOSIS — I10 Essential (primary) hypertension: Secondary | ICD-10-CM

## 2023-04-01 ENCOUNTER — Other Ambulatory Visit: Payer: Self-pay | Admitting: Cardiology

## 2023-04-01 DIAGNOSIS — I480 Paroxysmal atrial fibrillation: Secondary | ICD-10-CM

## 2023-04-01 DIAGNOSIS — I10 Essential (primary) hypertension: Secondary | ICD-10-CM

## 2023-04-19 ENCOUNTER — Other Ambulatory Visit: Payer: Self-pay | Admitting: Cardiology

## 2023-04-20 NOTE — Telephone Encounter (Signed)
Prescription refill request for Eliquis received. Indication:afib Last office visit:upcoming UJW:JXBJY labs Age: 47 Weight:117.5  kg  Prescription refilled

## 2023-05-03 ENCOUNTER — Other Ambulatory Visit: Payer: Self-pay | Admitting: Medical

## 2023-05-03 ENCOUNTER — Other Ambulatory Visit: Payer: Self-pay | Admitting: Cardiology

## 2023-05-03 DIAGNOSIS — I10 Essential (primary) hypertension: Secondary | ICD-10-CM

## 2023-05-03 DIAGNOSIS — I480 Paroxysmal atrial fibrillation: Secondary | ICD-10-CM

## 2023-05-17 ENCOUNTER — Other Ambulatory Visit: Payer: Self-pay | Admitting: Cardiology

## 2023-05-17 DIAGNOSIS — I480 Paroxysmal atrial fibrillation: Secondary | ICD-10-CM

## 2023-05-17 DIAGNOSIS — I10 Essential (primary) hypertension: Secondary | ICD-10-CM

## 2023-06-03 ENCOUNTER — Other Ambulatory Visit: Payer: Self-pay | Admitting: Cardiology

## 2023-06-03 DIAGNOSIS — I10 Essential (primary) hypertension: Secondary | ICD-10-CM

## 2023-06-03 DIAGNOSIS — I480 Paroxysmal atrial fibrillation: Secondary | ICD-10-CM

## 2023-06-16 ENCOUNTER — Ambulatory Visit: Payer: BC Managed Care – PPO | Admitting: Cardiology

## 2023-06-29 ENCOUNTER — Other Ambulatory Visit: Payer: Self-pay

## 2023-06-30 ENCOUNTER — Encounter: Payer: Self-pay | Admitting: Cardiology

## 2023-06-30 ENCOUNTER — Ambulatory Visit: Payer: BC Managed Care – PPO | Attending: Cardiology | Admitting: Cardiology

## 2023-06-30 VITALS — BP 116/70 | HR 82 | Ht 72.0 in | Wt 258.1 lb

## 2023-06-30 DIAGNOSIS — I10 Essential (primary) hypertension: Secondary | ICD-10-CM | POA: Diagnosis not present

## 2023-06-30 DIAGNOSIS — Z1321 Encounter for screening for nutritional disorder: Secondary | ICD-10-CM

## 2023-06-30 DIAGNOSIS — E782 Mixed hyperlipidemia: Secondary | ICD-10-CM

## 2023-06-30 DIAGNOSIS — Z833 Family history of diabetes mellitus: Secondary | ICD-10-CM

## 2023-06-30 DIAGNOSIS — I4891 Unspecified atrial fibrillation: Secondary | ICD-10-CM | POA: Diagnosis not present

## 2023-06-30 DIAGNOSIS — I2510 Atherosclerotic heart disease of native coronary artery without angina pectoris: Secondary | ICD-10-CM

## 2023-06-30 DIAGNOSIS — Z131 Encounter for screening for diabetes mellitus: Secondary | ICD-10-CM

## 2023-06-30 NOTE — Progress Notes (Signed)
Cardiology Office Note:    Date:  06/30/2023   ID:  Mark Dorsey, DOB 1976-03-14, MRN [id number]  PCP:  Esperanza Richters, PA-C  Cardiologist:  Garwin Brothers, MD   Referring MD: Esperanza Richters, PA-C    ASSESSMENT:    1. Primary hypertension    PLAN:    In order of problems listed above:  Elevated calcium score: Secondary prevention stressed with the patient.  Importance of compliance with diet medication stressed and he verbalized understanding.  He was advised to walk at least 30 to 40 minutes on a regular basis and he promises to do so. Paroxysmal atrial fibrillation:I discussed with the patient atrial fibrillation, disease process. Management and therapy including rate and rhythm control, anticoagulation benefits and potential risks were discussed extensively with the patient. Patient had multiple questions which were answered to patient's satisfaction.  He has an appointment with our electrophysiology colleague to evaluate ablation intervention. Mixed dyslipidemia: On lipid-lowering medications he will be back in the next few days will complete blood work.  Will do a hemoglobin A1c and he request vitamin D levels and we will check this for him in view of history of deficiency. Obesity: Weight reduction stressed diet emphasized and he promises to do better. Patient will be seen in follow-up appointment in 4 months or earlier if the patient has any concerns.    Medication Adjustments/Labs and Tests Ordered: Current medicines are reviewed at length with the patient today.  Concerns regarding medicines are outlined above.  Orders Placed This Encounter  Procedures   EKG 12-Lead   No orders of the defined types were placed in this encounter.    No chief complaint on file.    History of Present Illness:    Mark Dorsey is a 47 y.o. male.  Patient has past medical history of markedly elevated calcium score, essential hypertension, dyslipidemia and obesity.  He denies  any problems at this time and takes care of activities of daily living.  No chest pain orthopnea or PND.  At the time of my evaluation, the patient is alert awake oriented and in no distress.  He mentions to me that he has been lax with exercise as he has a newborn baby.  Past Medical History:  Diagnosis Date   Anxiety    Cardiac murmur 01/12/2022   Chest pain of uncertain etiology 03/19/2022   Elevated coronary artery calcium score 03/19/2022   Essential hypertension 05/30/2015   Family history of diabetes mellitus (DM) 05/31/2015   Family history of hyperlipidemia 05/31/2015   Gastroesophageal reflux disease without esophagitis 05/31/2015   Hypertension    Left atrial enlargement 02/13/2022   Migraine without aura 05/31/2015   Mixed dyslipidemia 03/19/2022   Obesity (BMI 35.0-39.9 without comorbidity) 01/12/2022   PAF (paroxysmal atrial fibrillation) (HCC) 02/13/2022   Palpitations 01/12/2022   Panic attacks 05/30/2015    Past Surgical History:  Procedure Laterality Date   NO PAST SURGERIES      Current Medications: Current Meds  Medication Sig   apixaban (ELIQUIS) 5 MG TABS tablet TAKE ONE TABLET BY MOUTH TWICE A DAY   clonazePAM (KLONOPIN) 0.5 MG tablet TAKE ONE TABLET BY MOUTH TWICE A DAY AS NEEDED ANXIETY   famotidine (PEPCID) 10 MG tablet Take 10 mg by mouth 2 (two) times daily.   nebivolol (BYSTOLIC) 5 MG tablet Take 1 tablet (5 mg total) by mouth daily. Patient must keep appointment on 06-30-23 for further refills. 3 rd/final attempt   rosuvastatin (CRESTOR) 10 MG tablet  Take 1 tablet (10 mg total) by mouth daily.   venlafaxine XR (EFFEXOR-XR) 37.5 MG 24 hr capsule TAKE ONE CAPSULE BY MOUTH ONE TIME DAILY WITH BREAKFAST     Allergies:   Penicillins   Social History   Socioeconomic History   Marital status: Married    Spouse name: Not on file   Number of children: Not on file   Years of education: Not on file   Highest education level: Not on file  Occupational  History   Not on file  Tobacco Use   Smoking status: Former    Current packs/day: 0.00    Types: Cigarettes    Quit date: 2007    Years since quitting: 17.6   Smokeless tobacco: Never  Vaping Use   Vaping status: Never Used  Substance and Sexual Activity   Alcohol use: Yes    Comment: 3 beers a week.   Drug use: No   Sexual activity: Yes  Other Topics Concern   Not on file  Social History Narrative   Not on file   Social Determinants of Health   Financial Resource Strain: Not on file  Food Insecurity: Not on file  Transportation Needs: Not on file  Physical Activity: Not on file  Stress: Not on file  Social Connections: Not on file     Family History: The patient's family history includes Asthma in his paternal grandmother; Colon polyps in his father and paternal grandfather; Diabetes in his father; Hematuria in his father and paternal grandfather; Hypertension in his father, paternal grandmother, and sister; Nephrolithiasis in his mother.  ROS:   Please see the history of present illness.    All other systems reviewed and are negative.  EKGs/Labs/Other Studies Reviewed:    The following studies were reviewed today: .Marland KitchenEKG Interpretation Date/Time:  Wednesday June 30 2023 15:42:03 EDT Ventricular Rate:  82 PR Interval:  146 QRS Duration:  94 QT Interval:  366 QTC Calculation: 427 R Axis:   -6  Text Interpretation: Sinus rhythm with occasional Premature ventricular complexes and Premature atrial complexes Inferior infarct , age undetermined No previous ECGs available Confirmed by Belva Crome [phone]) on 06/30/2023 4:21:02 PM     Recent Labs: No results found for requested labs within last 365 days.  Recent Lipid Panel    Component Value Date/Time   CHOL 166 03/19/2022 0852   TRIG 135 03/19/2022 0852   HDL 31 (L) 03/19/2022 0852   CHOLHDL 5.4 (H) 03/19/2022 0852   CHOLHDL 6 10/11/2018 0705   VLDL 39.2 10/11/2018 0705   LDLCALC 111 (H) 03/19/2022 0852    LDLDIRECT 98.0 06/30/2017 0729    Physical Exam:    VS:  BP 116/70   Pulse 82   Ht 6' (1.829 m)   Wt 258 lb 1.9 oz (117.1 kg)   SpO2 94%   BMI 35.01 kg/m     Wt Readings from Last 3 Encounters:  06/30/23 258 lb 1.9 oz (117.1 kg)  03/31/22 259 lb (117.5 kg)  03/19/22 259 lb (117.5 kg)     GEN: Patient is in no acute distress HEENT: Normal NECK: No JVD; No carotid bruits LYMPHATICS: No lymphadenopathy CARDIAC: Hear sounds regular, 2/6 systolic murmur at the apex. RESPIRATORY:  Clear to auscultation without rales, wheezing or rhonchi  ABDOMEN: Soft, non-tender, non-distended MUSCULOSKELETAL:  No edema; No deformity  SKIN: Warm and dry NEUROLOGIC:  Alert and oriented x 3 PSYCHIATRIC:  Normal affect   Signed, Garwin Brothers, MD  06/30/2023 4:21  PM    [facility]

## 2023-06-30 NOTE — Patient Instructions (Signed)
Medication Instructions:  Your physician recommends that you continue on your current medications as directed. Please refer to the Current Medication list given to you today.  *If you need a refill on your cardiac medications before your next appointment, please call your pharmacy*   Lab Work: Your physician recommends that you return for lab work in: the next few days for CMP, CBC, TSH, A1C, vitamin D and lipids. You need to have labs done when you are fasting. [facility] lab is located on the 3rd floor, [address]. Hours are Monday - Friday 8 am to 4 pm, closed 11:30 am to 1:00 pm. You do NOT need an appointment.   If you have labs (blood work) drawn today and your tests are completely normal, you will receive your results only by: MyChart Message (if you have MyChart) OR A paper copy in the mail If you have any lab test that is abnormal or we need to change your treatment, we will call you to review the results.   Testing/Procedures: None ordered   Follow-Up: At [facility], you and your health needs are our priority.  As part of our continuing mission to provide you with exceptional heart care, we have created designated Provider Care Teams.  These Care Teams include your primary Cardiologist (physician) and Advanced Practice Providers (APPs -  Physician Assistants and Nurse Practitioners) who all work together to provide you with the care you need, when you need it.  We recommend signing up for the patient portal called "MyChart".  Sign up information is provided on this After Visit Summary.  MyChart is used to connect with patients for Virtual Visits (Telemedicine).  Patients are able to view lab/test results, encounter notes, upcoming appointments, etc.  Non-urgent messages can be sent to your provider as well.   To learn more about what you can do with MyChart, go to [email or web address].    Your next appointment:   4 month(s)  The format for your next appointment:    In Person  Provider:   Belva Crome, MD    Other Instructions none  Important Information About Sugar

## 2023-07-02 ENCOUNTER — Other Ambulatory Visit: Payer: Self-pay | Admitting: Cardiology

## 2023-07-02 DIAGNOSIS — I10 Essential (primary) hypertension: Secondary | ICD-10-CM

## 2023-07-02 DIAGNOSIS — I480 Paroxysmal atrial fibrillation: Secondary | ICD-10-CM

## 2023-07-02 NOTE — Telephone Encounter (Signed)
Rx refill sent to pharmacy. 

## 2023-07-09 DIAGNOSIS — I10 Essential (primary) hypertension: Secondary | ICD-10-CM | POA: Diagnosis not present

## 2023-07-09 DIAGNOSIS — I4891 Unspecified atrial fibrillation: Secondary | ICD-10-CM | POA: Diagnosis not present

## 2023-07-09 DIAGNOSIS — I2510 Atherosclerotic heart disease of native coronary artery without angina pectoris: Secondary | ICD-10-CM | POA: Diagnosis not present

## 2023-07-09 DIAGNOSIS — Z833 Family history of diabetes mellitus: Secondary | ICD-10-CM | POA: Diagnosis not present

## 2023-07-09 DIAGNOSIS — E782 Mixed hyperlipidemia: Secondary | ICD-10-CM | POA: Diagnosis not present

## 2023-07-09 DIAGNOSIS — Z131 Encounter for screening for diabetes mellitus: Secondary | ICD-10-CM | POA: Diagnosis not present

## 2023-07-10 LAB — HEMOGLOBIN A1C
Est. average glucose Bld gHb Est-mCnc: 114 mg/dL
Hgb A1c MFr Bld: 5.6 % (ref 4.8–5.6)

## 2023-07-10 LAB — COMPREHENSIVE METABOLIC PANEL
ALT: 24 IU/L (ref 0–44)
AST: 20 IU/L (ref 0–40)
Albumin: 5 g/dL (ref 4.1–5.1)
Alkaline Phosphatase: 72 IU/L (ref 44–121)
BUN/Creatinine Ratio: 12 (ref 9–20)
BUN: 13 mg/dL (ref 6–24)
Bilirubin Total: 0.4 mg/dL (ref 0.0–1.2)
CO2: 22 mmol/L (ref 20–29)
Calcium: 9.6 mg/dL (ref 8.7–10.2)
Chloride: 104 mmol/L (ref 96–106)
Creatinine, Ser: 1.11 mg/dL (ref 0.76–1.27)
Globulin, Total: 2.5 g/dL (ref 1.5–4.5)
Glucose: 102 mg/dL — ABNORMAL HIGH (ref 70–99)
Potassium: 5 mmol/L (ref 3.5–5.2)
Sodium: 148 mmol/L — ABNORMAL HIGH (ref 134–144)
Total Protein: 7.5 g/dL (ref 6.0–8.5)
eGFR: 83 mL/min/{1.73_m2} (ref >59)

## 2023-07-10 LAB — VITAMIN D 25 HYDROXY (VIT D DEFICIENCY, FRACTURES): Vit D, 25-Hydroxy: 17.7 ng/mL — ABNORMAL LOW (ref 30.0–100.0)

## 2023-07-10 LAB — CBC
Hematocrit: 50.3 % (ref 37.5–51.0)
Hemoglobin: 16.1 g/dL (ref 13.0–17.7)
MCH: 30 pg (ref 26.6–33.0)
MCHC: 32 g/dL (ref 31.5–35.7)
MCV: 94 fL (ref 79–97)
Platelets: 248 10*3/uL (ref 150–450)
RBC: 5.36 x10E6/uL (ref 4.14–5.80)
RDW: 12.8 % (ref 11.6–15.4)
WBC: 5.6 10*3/uL (ref 3.4–10.8)

## 2023-07-10 LAB — LIPID PANEL
Chol/HDL Ratio: 4.7 ratio (ref 0.0–5.0)
Cholesterol, Total: 140 mg/dL (ref 100–199)
HDL: 30 mg/dL — ABNORMAL LOW (ref >39)
LDL Chol Calc (NIH): 92 mg/dL (ref 0–99)
Triglycerides: 96 mg/dL (ref 0–149)
VLDL Cholesterol Cal: 18 mg/dL (ref 5–40)

## 2023-07-10 LAB — TSH: TSH: 1.75 u[IU]/mL (ref 0.450–4.500)

## 2023-07-12 ENCOUNTER — Telehealth: Payer: Self-pay

## 2023-07-12 DIAGNOSIS — E782 Mixed hyperlipidemia: Secondary | ICD-10-CM

## 2023-07-12 DIAGNOSIS — I2510 Atherosclerotic heart disease of native coronary artery without angina pectoris: Secondary | ICD-10-CM

## 2023-07-12 MED ORDER — ROSUVASTATIN CALCIUM 20 MG PO TABS: 20.0000 mg | ORAL_TABLET | Freq: Every day | ORAL | 3 refills | Status: DC

## 2023-07-12 NOTE — Telephone Encounter (Signed)
-----   Message from [place] Revankar sent at 07/12/2023 12:14 AM EDT ----- Double statin, diet and LL in 6wks cc pcp. VitD by pcp Garwin Brothers, MD 07/12/2023 12:13 AM

## 2023-07-12 NOTE — Telephone Encounter (Signed)
MyChart message

## 2023-07-30 ENCOUNTER — Ambulatory Visit: Payer: BC Managed Care – PPO | Admitting: Medical

## 2023-08-02 ENCOUNTER — Other Ambulatory Visit: Payer: Self-pay | Admitting: Medical

## 2023-08-06 ENCOUNTER — Ambulatory Visit: Payer: BC Managed Care – PPO | Admitting: Medical

## 2023-08-09 ENCOUNTER — Ambulatory Visit: Payer: BC Managed Care – PPO | Admitting: Medical

## 2023-08-11 ENCOUNTER — Ambulatory Visit: Payer: BC Managed Care – PPO | Admitting: Medical

## 2023-08-13 ENCOUNTER — Other Ambulatory Visit (HOSPITAL_BASED_OUTPATIENT_CLINIC_OR_DEPARTMENT_OTHER): Payer: Self-pay

## 2023-08-13 ENCOUNTER — Encounter: Payer: Self-pay | Admitting: Medical

## 2023-08-13 ENCOUNTER — Ambulatory Visit: Payer: BC Managed Care – PPO | Admitting: Medical

## 2023-08-13 VITALS — BP 130/65 | HR 92 | Temp 99.2°F | Resp 16 | Ht 73.0 in | Wt 256.4 lb

## 2023-08-13 DIAGNOSIS — I10 Essential (primary) hypertension: Secondary | ICD-10-CM

## 2023-08-13 DIAGNOSIS — Z23 Encounter for immunization: Secondary | ICD-10-CM | POA: Diagnosis not present

## 2023-08-13 DIAGNOSIS — E559 Vitamin D deficiency, unspecified: Secondary | ICD-10-CM

## 2023-08-13 DIAGNOSIS — F419 Anxiety disorder, unspecified: Secondary | ICD-10-CM

## 2023-08-13 DIAGNOSIS — Z1211 Encounter for screening for malignant neoplasm of colon: Secondary | ICD-10-CM | POA: Diagnosis not present

## 2023-08-13 DIAGNOSIS — I480 Paroxysmal atrial fibrillation: Secondary | ICD-10-CM | POA: Diagnosis not present

## 2023-08-13 DIAGNOSIS — K219 Gastro-esophageal reflux disease without esophagitis: Secondary | ICD-10-CM

## 2023-08-13 MED ORDER — COVID-19 MRNA VAC-TRIS(PFIZER) 30 MCG/0.3ML IM SUSY
0.3000 mL | PREFILLED_SYRINGE | Freq: Once | INTRAMUSCULAR | 0 refills | Status: AC
  Filled 2023-08-13: qty 0.3, 1d supply, fill #0

## 2023-08-13 MED ORDER — CLONAZEPAM 0.5 MG PO TABS: 0.5000 mg | ORAL_TABLET | Freq: Two times a day (BID) | ORAL | 0 refills | Status: AC | PRN

## 2023-08-13 NOTE — Progress Notes (Signed)
Subjective:    Patient ID: Mark Dorsey, male    DOB: 1976-09-06, 47 y.o.   MRN: [id number]  HPI  Pt in for follow up. Has been a while since last visit.   Pt has htn and paroxysmal atrial fibrillation.   Elevated calcium score: Secondary prevention stressed with the patient.  Importance of compliance with diet medication stressed and he verbalized understanding.  He was advised to walk at least 30 to 40 minutes on a regular basis and he promises to do so. Paroxysmal atrial fibrillation:I discussed with the patient atrial fibrillation, disease process. Management and therapy including rate and rhythm control, anticoagulation benefits and potential risks were discussed extensively with the patient. Patient had multiple questions which were answered to patient's satisfaction.  He has an appointment with our electrophysiology colleague to evaluate ablation intervention. Mixed dyslipidemia: On lipid-lowering medications he will be back in the next few days will complete blood work.  Will do a hemoglobin A1c and he request vitamin D levels and we will check this for him in view of history of deficiency. Obesity: Weight reduction stressed diet emphasized and he promises to do better.   Genella Rife- he states controlled with otc pepcid 20 mg twice daily.  Pt he is still anxious- he use clonazepam sporadically. He wants to be able to get refills through year as needed. Discussed with best to placed on contrast.    Review of Systems  Constitutional:  Negative for chills, fatigue and fever.  HENT:  Negative for congestion and ear discharge.   Respiratory:  Negative for cough, chest tightness, shortness of breath and wheezing.   Cardiovascular:  Negative for chest pain and palpitations.  Gastrointestinal:  Negative for abdominal pain, blood in stool and diarrhea.  Genitourinary:  Negative for dysuria and frequency.  Musculoskeletal:  Negative for back pain and joint swelling.  Skin:  Negative for  color change and rash.  Neurological:  Negative for dizziness, seizures and light-headedness.  Hematological:  Negative for adenopathy. Does not bruise/bleed easily.  Psychiatric/Behavioral:  Negative for behavioral problems, decreased concentration and dysphoric mood. The patient is not nervous/anxious.        Past Medical History:  Diagnosis Date   Anxiety    Cardiac murmur 01/12/2022   Chest pain of uncertain etiology 03/19/2022   Elevated coronary artery calcium score 03/19/2022   Essential hypertension 05/30/2015   Family history of diabetes mellitus (DM) 05/31/2015   Family history of hyperlipidemia 05/31/2015   Gastroesophageal reflux disease without esophagitis 05/31/2015   Hypertension    Left atrial enlargement 02/13/2022   Migraine without aura 05/31/2015   Mixed dyslipidemia 03/19/2022   Obesity (BMI 35.0-39.9 without comorbidity) 01/12/2022   PAF (paroxysmal atrial fibrillation) (HCC) 02/13/2022   Palpitations 01/12/2022   Panic attacks 05/30/2015     Social History   Socioeconomic History   Marital status: Married    Spouse name: Not on file   Number of children: Not on file   Years of education: Not on file   Highest education level: Bachelor's degree (e.g., BA, AB, BS)  Occupational History   Not on file  Tobacco Use   Smoking status: Former    Current packs/day: 0.00    Types: Cigarettes    Quit date: 2007    Years since quitting: 17.7   Smokeless tobacco: Never  Vaping Use   Vaping status: Never Used  Substance and Sexual Activity   Alcohol use: Yes    Comment: 3 beers a week.  Drug use: No   Sexual activity: Yes  Other Topics Concern   Not on file  Social History Narrative   Not on file   Social Determinants of Health   Financial Resource Strain: Low Risk  (08/13/2023)   Overall Financial Resource Strain (CARDIA)    Difficulty of Paying Living Expenses: Not very hard  Food Insecurity: No Food Insecurity (08/13/2023)   Hunger Vital  Sign    Worried About Running Out of Food in the Last Year: Never true    Ran Out of Food in the Last Year: Never true  Transportation Needs: No Transportation Needs (08/13/2023)   PRAPARE - Administrator, Civil Service (Medical): No    Lack of Transportation (Non-Medical): No  Physical Activity: Insufficiently Active (08/13/2023)   Exercise Vital Sign    Days of Exercise per Week: 3 days    Minutes of Exercise per Session: 30 min  Stress: Stress Concern Present (08/13/2023)   [employer] of Occupational Health - Occupational Stress Questionnaire    Feeling of Stress : Rather much  Social Connections: Moderately Isolated (08/13/2023)   Social Connection and Isolation Panel [NHANES]    Frequency of Communication with Friends and Family: More than three times a week    Frequency of Social Gatherings with Friends and Family: Once a week    Attends Religious Services: Never    Database administrator or Organizations: No    Attends Engineer, structural: Not on file    Marital Status: Married  Catering manager Violence: Not on file    Past Surgical History:  Procedure Laterality Date   NO PAST SURGERIES      Family History  Problem Relation Age of Onset   Nephrolithiasis Mother    Diabetes Father    Hypertension Father    Hematuria Father    Colon polyps Father    Hypertension Sister    Hypertension Paternal Grandmother    Asthma Paternal Grandmother    Hematuria Paternal Grandfather    Colon polyps Paternal Grandfather     Allergies  Allergen Reactions   Penicillins     Current Outpatient Medications on File Prior to Visit  Medication Sig Dispense Refill   apixaban (ELIQUIS) 5 MG TABS tablet TAKE ONE TABLET BY MOUTH TWICE A DAY 180 tablet 1   clonazePAM (KLONOPIN) 0.5 MG tablet TAKE ONE TABLET BY MOUTH TWICE A DAY AS NEEDED ANXIETY 4 tablet 0   famotidine (PEPCID) 10 MG tablet Take 10 mg by mouth 2 (two) times daily.     nebivolol (BYSTOLIC)  5 MG tablet Take 1 tablet (5 mg total) by mouth daily. 90 tablet 3   rosuvastatin (CRESTOR) 20 MG tablet Take 1 tablet (20 mg total) by mouth daily. 90 tablet 3   venlafaxine XR (EFFEXOR-XR) 37.5 MG 24 hr capsule TAKE ONE CAPSULE BY MOUTH ONE TIME DAILY WITH BREAKFAST 90 capsule 0   No current facility-administered medications on file prior to visit.    BP 130/65   Pulse 92   Temp 99.2 F (37.3 C) (Oral)   Resp 16   Ht 6\' 1"  (1.854 m)   Wt 256 lb 6.4 oz (116.3 kg)   SpO2 97%   BMI 33.83 kg/m        Objective:   Physical Exam  General Mental Status- Alert. General Appearance- Not in acute distress.   Skin General: Color- Normal Color. Moisture- Normal Moisture.  Neck Carotid Arteries- Normal color. Moisture- Normal Moisture.  No carotid bruits. No JVD.  Chest and Lung Exam Auscultation: Breath Sounds:-Normal.  Cardiovascular Auscultation:Rythm- Regular. Murmurs & Other Heart Sounds:Auscultation of the heart reveals- No Murmurs.  Abdomen Inspection:-Inspeection Normal. Palpation/Percussion:Note:No mass. Palpation and Percussion of the abdomen reveal- Non Tender, Non Distended + BS, no rebound or guarding.   Neurologic Cranial Nerve exam:- CN III-XII intact(No nystagmus), symmetric smile. Strength:- 5/5 equal and symmetric strength both upper and lower extremities.       Assessment & Plan:   Patient Instructions   Essential hypertension and PAF (paroxysmal atrial fibrillation) - continue bystolic and follow up with cardiologist.   Gastroesophageal reflux disease without esophagitis -advise on healthy diet and can continue pepcid  Screening for colon cancer  - Ambulatory referral to Gastroenterology  Anxiety- - rx low dose and low number of clonazepam. Gave list of counselors that you can call. If you choose one and need a referral let me know. -plan on getting you scheduled for uds and have you sign contract as we discussed need for referral randomly in  the future.  Flu vaccine today. You also plan on getting covid vaccine.  Follow up in 6 month or sooner if needed.     Esperanza Richters, PA-C

## 2023-08-13 NOTE — Patient Instructions (Addendum)
Essential hypertension and PAF (paroxysmal atrial fibrillation) - continue bystolic and follow up with cardiologist.   Gastroesophageal reflux disease without esophagitis -advise on healthy diet and can continue pepcid  Screening for colon cancer  - Ambulatory referral to Gastroenterology  Anxiety- - rx low dose and low number of clonazepam. Gave list of counselors that you can call. If you choose one and need a referral let me know. -plan on getting you scheduled for uds and have you sign contract as we discussed need for referral randomly in the future.  Flu vaccine today. You also plan on getting covid vaccine.  Follow up in 6 month or sooner if needed.

## 2023-09-06 NOTE — Addendum Note (Signed)
Addended by: Kathi Ludwig on: 09/06/2023 01:44 PM   Modules accepted: Orders

## 2023-10-20 ENCOUNTER — Ambulatory Visit: Payer: BC Managed Care – PPO | Admitting: Cardiology

## 2023-10-24 ENCOUNTER — Other Ambulatory Visit: Payer: Self-pay | Admitting: Cardiology

## 2023-10-25 NOTE — Telephone Encounter (Signed)
Prescription refill request for Eliquis received. Indication:afib Last office visit:8/24 Scr:1.11  9/24 Age: 47 Weight:116.3  kg  Prescription refilled

## 2023-10-31 ENCOUNTER — Other Ambulatory Visit: Payer: Self-pay | Admitting: Medical

## 2023-11-01 ENCOUNTER — Encounter: Payer: Self-pay | Admitting: Medical

## 2023-11-01 MED ORDER — VITAMIN D (ERGOCALCIFEROL) 1.25 MG (50000 UNIT) PO CAPS: 50000.0000 [IU] | ORAL_CAPSULE | ORAL | 0 refills | Status: AC

## 2023-11-01 NOTE — Addendum Note (Signed)
Addended by: Gwenevere Abbot on: 11/01/2023 12:05 PM   Modules accepted: Orders

## 2023-11-05 ENCOUNTER — Ambulatory Visit: Payer: BC Managed Care – PPO | Admitting: Cardiology

## 2023-11-11 ENCOUNTER — Encounter: Payer: Self-pay | Admitting: Cardiology

## 2023-11-11 ENCOUNTER — Ambulatory Visit: Payer: BC Managed Care – PPO | Attending: Cardiology | Admitting: Cardiology

## 2023-11-11 VITALS — BP 134/82 | HR 64 | Ht 72.0 in | Wt 252.1 lb

## 2023-11-11 DIAGNOSIS — I480 Paroxysmal atrial fibrillation: Secondary | ICD-10-CM

## 2023-11-11 DIAGNOSIS — R931 Abnormal findings on diagnostic imaging of heart and coronary circulation: Secondary | ICD-10-CM | POA: Diagnosis not present

## 2023-11-11 DIAGNOSIS — E669 Obesity, unspecified: Secondary | ICD-10-CM

## 2023-11-11 DIAGNOSIS — I10 Essential (primary) hypertension: Secondary | ICD-10-CM

## 2023-11-11 DIAGNOSIS — E782 Mixed hyperlipidemia: Secondary | ICD-10-CM

## 2023-11-11 DIAGNOSIS — R0683 Snoring: Secondary | ICD-10-CM | POA: Insufficient documentation

## 2023-11-11 NOTE — Patient Instructions (Signed)
Medication Instructions:  Your physician recommends that you continue on your current medications as directed. Please refer to the Current Medication list given to you today.  *If you need a refill on your cardiac medications before your next appointment, please call your pharmacy*   Lab Work: Your physician recommends that you return for lab work in: the next few days for CMP and lipids. You need to have labs done when you are fasting. [facility] lab is located on the 3rd floor, [address]. Hours are Monday - Friday 8 am to 4 pm, closed 11:30 am to 1:00 pm. You do NOT need an appointment.   If you have labs (blood work) drawn today and your tests are completely normal, you will receive your results only by: MyChart Message (if you have MyChart) OR A paper copy in the mail If you have any lab test that is abnormal or we need to change your treatment, we will call you to review the results.   Testing/Procedures: None ordered   Follow-Up: At [facility], you and your health needs are our priority.  As part of our continuing mission to provide you with exceptional heart care, we have created designated Provider Care Teams.  These Care Teams include your primary Cardiologist (physician) and Advanced Practice Providers (APPs -  Physician Assistants and Nurse Practitioners) who all work together to provide you with the care you need, when you need it.  We recommend signing up for the patient portal called "MyChart".  Sign up information is provided on this After Visit Summary.  MyChart is used to connect with patients for Virtual Visits (Telemedicine).  Patients are able to view lab/test results, encounter notes, upcoming appointments, etc.  Non-urgent messages can be sent to your provider as well.   To learn more about what you can do with MyChart, go to [email or web address].    Your next appointment:   9 month(s)  The format for your next appointment:   In Person  Provider:    Rajan Revankar, MD    Other Instructions none  Important Information About Sugar       

## 2023-11-11 NOTE — Progress Notes (Signed)
 Cardiology Office Note:    Date:  11/11/2023   ID:  Mark Dorsey, DOB [birth year], MRN [id number]  PCP:  Dorina Loving, PA-C  Cardiologist:  Jennifer JONELLE Crape, MD   Referring MD: Dorina Loving, PA-C    ASSESSMENT:    1. PAF (paroxysmal atrial fibrillation) (HCC)   2. Essential hypertension   3. Elevated coronary artery calcium  score   4. Obesity (BMI 35.0-39.9 without comorbidity)   5. Mixed dyslipidemia    PLAN:    In order of problems listed above:  Elevated calcium  score: Secondary prevention stressed to the patient.  Importance of compliance with medication stressed and he vocalized understanding.  He was advised to walk at least half an hour a day 5 days a week and he promises to do so. Essential hypertension: Blood pressure stable and diet was emphasized. Mixed dyslipidemia: On lipid-lowering medications.  Lipids not at goal and he plans to come back for blood work in the next few days.  Diet emphasized. Obesity: Weight reduction stressed diet emphasized and patient promises to do better.  Risks of obesity explained. Paroxysmal atrial fibrillation:I discussed with the patient atrial fibrillation, disease process. Management and therapy including rate and rhythm control, anticoagulation benefits and potential risks were discussed extensively with the patient. Patient had multiple questions which were answered to patient's satisfaction. Snoring: He wants to do sleep study and he has not completed the study and plans to complete at this time. Patient will be seen in follow-up appointment in 9 months or earlier if the patient has any concerns.    Medication Adjustments/Labs and Tests Ordered: Current medicines are reviewed at length with the patient today.  Concerns regarding medicines are outlined above.  No orders of the defined types were placed in this encounter.  No orders of the defined types were placed in this encounter.    No chief complaint on file.     History of Present Illness:    Mark Dorsey is a 48 y.o. male.  Patient has past medical history of elevated calcium  score, essential hypertension, dyslipidemia and obesity.  He has history of paroxysmal fibrillation.  He denies any problems at this time and takes care of activities of daily living.  No chest pain orthopnea or PND.  At the time of my evaluation, the patient is alert awake oriented and in no distress.  Past Medical History:  Diagnosis Date   Anxiety    Cardiac murmur 01/12/2022   Chest pain of uncertain etiology 03/19/2022   Elevated coronary artery calcium  score 03/19/2022   Essential hypertension 05/30/2015   Family history of diabetes mellitus (DM) 05/31/2015   Family history of hyperlipidemia 05/31/2015   Gastroesophageal reflux disease without esophagitis 05/31/2015   Hypertension    Left atrial enlargement 02/13/2022   Migraine without aura 05/31/2015   Mixed dyslipidemia 03/19/2022   Obesity (BMI 35.0-39.9 without comorbidity) 01/12/2022   PAF (paroxysmal atrial fibrillation) (HCC) 02/13/2022   Palpitations 01/12/2022   Panic attacks 05/30/2015    Past Surgical History:  Procedure Laterality Date   NO PAST SURGERIES      Current Medications: Current Meds  Medication Sig   clonazePAM  (KLONOPIN ) 0.5 MG tablet TAKE ONE TABLET BY MOUTH TWICE A DAY AS NEEDED ANXIETY   clonazePAM  (KLONOPIN ) 0.5 MG tablet Take 1 tablet (0.5 mg total) by mouth 2 (two) times daily as needed for anxiety.   ELIQUIS  5 MG TABS tablet TAKE ONE TABLET BY MOUTH TWICE A DAY   famotidine (PEPCID) 10 MG  tablet Take 10 mg by mouth 2 (two) times daily.   nebivolol  (BYSTOLIC ) 5 MG tablet Take 1 tablet (5 mg total) by mouth daily.   rosuvastatin  (CRESTOR ) 20 MG tablet Take 1 tablet (20 mg total) by mouth daily.   venlafaxine  XR (EFFEXOR -XR) 37.5 MG 24 hr capsule Take 1 capsule (37.5 mg total) by mouth daily with breakfast.   Vitamin D , Ergocalciferol , (DRISDOL ) 1.25 MG (50000 UNIT) CAPS  capsule Take 1 capsule (50,000 Units total) by mouth every 7 (seven) days.     Allergies:   Penicillins   Social History   Socioeconomic History   Marital status: Married    Spouse name: Not on file   Number of children: Not on file   Years of education: Not on file   Highest education level: Bachelor's degree (e.g., BA, AB, BS)  Occupational History   Not on file  Tobacco Use   Smoking status: Former    Current packs/day: 0.00    Types: Cigarettes    Quit date: 2007    Years since quitting: 18.0   Smokeless tobacco: Never  Vaping Use   Vaping status: Never Used  Substance and Sexual Activity   Alcohol use: Yes    Comment: 3 beers a week.   Drug use: No   Sexual activity: Yes  Other Topics Concern   Not on file  Social History Narrative   Not on file   Social Drivers of Health   Financial Resource Strain: Low Risk  (08/13/2023)   Overall Financial Resource Strain (CARDIA)    Difficulty of Paying Living Expenses: Not very hard  Food Insecurity: No Food Insecurity (08/13/2023)   Hunger Vital Sign    Worried About Running Out of Food in the Last Year: Never true    Ran Out of Food in the Last Year: Never true  Transportation Needs: No Transportation Needs (08/13/2023)   PRAPARE - Administrator, Civil Service (Medical): No    Lack of Transportation (Non-Medical): No  Physical Activity: Insufficiently Active (08/13/2023)   Exercise Vital Sign    Days of Exercise per Week: 3 days    Minutes of Exercise per Session: 30 min  Stress: Stress Concern Present (08/13/2023)   [employer] of Occupational Health - Occupational Stress Questionnaire    Feeling of Stress : Rather much  Social Connections: Moderately Isolated (08/13/2023)   Social Connection and Isolation Panel [NHANES]    Frequency of Communication with Friends and Family: More than three times a week    Frequency of Social Gatherings with Friends and Family: Once a week    Attends Religious  Services: Never    Database Administrator or Organizations: No    Attends Engineer, Structural: Not on file    Marital Status: Married     Family History: The patient's family history includes Asthma in his paternal grandmother; Colon polyps in his father and paternal grandfather; Diabetes in his father; Hematuria in his father and paternal grandfather; Hypertension in his father, paternal grandmother, and sister; Nephrolithiasis in his mother.  ROS:   Please see the history of present illness.    All other systems reviewed and are negative.  EKGs/Labs/Other Studies Reviewed:    The following studies were reviewed today: LS My findings with the patient at length   Recent Labs: 07/09/2023: ALT 24; BUN 13; Creatinine, Ser 1.11; Hemoglobin 16.1; Platelets 248; Potassium 5.0; Sodium 148; TSH 1.750  Recent Lipid Panel    Component  Value Date/Time   CHOL 140 07/09/2023 0834   TRIG 96 07/09/2023 0834   HDL 30 (L) 07/09/2023 0834   CHOLHDL 4.7 07/09/2023 0834   CHOLHDL 6 10/11/2018 0705   VLDL 39.2 10/11/2018 0705   LDLCALC 92 07/09/2023 0834   LDLDIRECT 98.0 06/30/2017 0729    Physical Exam:    VS:  BP 134/82   Pulse 64   Ht 6' (1.829 m)   Wt 252 lb 1.9 oz (114.4 kg)   SpO2 96%   BMI 34.19 kg/m     Wt Readings from Last 3 Encounters:  11/11/23 252 lb 1.9 oz (114.4 kg)  08/13/23 256 lb 6.4 oz (116.3 kg)  06/30/23 258 lb 1.9 oz (117.1 kg)     GEN: Patient is in no acute distress HEENT: Normal NECK: No JVD; No carotid bruits LYMPHATICS: No lymphadenopathy CARDIAC: Hear sounds regular, 2/6 systolic murmur at the apex. RESPIRATORY:  Clear to auscultation without rales, wheezing or rhonchi  ABDOMEN: Soft, non-tender, non-distended MUSCULOSKELETAL:  No edema; No deformity  SKIN: Warm and dry NEUROLOGIC:  Alert and oriented x 3 PSYCHIATRIC:  Normal affect   Signed, Jennifer JONELLE Crape, MD  11/11/2023 3:50 PM    [facility]

## 2023-12-06 ENCOUNTER — Encounter: Payer: Self-pay | Admitting: Medical

## 2023-12-06 NOTE — Addendum Note (Signed)
Addended by: Gwenevere Abbot on: 12/06/2023 11:56 AM   Modules accepted: Orders

## 2023-12-08 ENCOUNTER — Telehealth: Payer: Self-pay | Admitting: *Deleted

## 2023-12-08 ENCOUNTER — Ambulatory Visit: Payer: BC Managed Care – PPO | Attending: Cardiology | Admitting: Cardiology

## 2023-12-08 ENCOUNTER — Encounter: Payer: Self-pay | Admitting: Cardiology

## 2023-12-08 ENCOUNTER — Other Ambulatory Visit: Payer: Self-pay | Admitting: Cardiology

## 2023-12-08 ENCOUNTER — Ambulatory Visit: Payer: BC Managed Care – PPO

## 2023-12-08 VITALS — BP 124/70 | HR 81 | Ht 72.0 in | Wt 247.0 lb

## 2023-12-08 DIAGNOSIS — I480 Paroxysmal atrial fibrillation: Secondary | ICD-10-CM

## 2023-12-08 DIAGNOSIS — I2510 Atherosclerotic heart disease of native coronary artery without angina pectoris: Secondary | ICD-10-CM

## 2023-12-08 DIAGNOSIS — I10 Essential (primary) hypertension: Secondary | ICD-10-CM

## 2023-12-08 NOTE — Progress Notes (Unsigned)
 Patient enrolled for [employer]/ [employer] to ship a 7 day long term monitor with a sensitive skin setup.

## 2023-12-08 NOTE — Progress Notes (Signed)
 Electrophysiology Office Follow up Visit Note:    Date:  12/08/2023   ID:  Mark Dorsey, DOB [birth year], MRN [id number]  PCP:  Dorina Dallas RIGGERS  [facility] Cardiologist:  None  [facility] Electrophysiologist:  OLE ONEIDA HOLTS, MD    Interval History:     Mark Dorsey is a 48 y.o. male who presents for a follow up visit.   I last saw the patient Mar 31, 2022 for atrial fibrillation.  He also has hypertension, GERD, obesity, coronary artery disease and anxiety.  He was scheduled for CT scan and catheter ablation but did not attend any of his preop evaluation appointments so the procedure was canceled.  He saw Dr. Edwyna November 11, 2023  Today he reports an improvement in his overall palpitation burden but does continue to experience symptoms at least once per week.  Since I last saw him he has had a child who is now 64 months old.  He reports social alcohol use.  Strong family history of sleep apnea.       Past medical, surgical, social and family history were reviewed.  ROS:   Please see the history of present illness.    All other systems reviewed and are negative.  EKGs/Labs/Other Studies Reviewed:    The following studies were reviewed today:  June 30, 2023 EKG shows sinus rhythm with PACs.        Physical Exam:    VS:  BP 124/70   Pulse 81   Ht 6' (1.829 m)   Wt 247 lb (112 kg)   SpO2 96%   BMI 33.50 kg/m     Wt Readings from Last 3 Encounters:  12/08/23 247 lb (112 kg)  11/11/23 252 lb 1.9 oz (114.4 kg)  08/13/23 256 lb 6.4 oz (116.3 kg)     GEN: no distress.  Obese CARD: RRR, No MRG RESP: No IWOB. CTAB.      ASSESSMENT:    1. Primary hypertension   2. PAF (paroxysmal atrial fibrillation) (HCC)   3. Coronary artery disease involving native coronary artery of native heart without angina pectoris    PLAN:    In order of problems listed above:  #Paroxysmal atrial fibrillation Symptomatic.  Previously scheduled for  ablation but did not attend any of the preop appointment so the procedure was canceled.  Is on Eliquis  for stroke prophylaxis. Today I again discussed treatment options for his atrial fibrillation including rhythm control with medications and catheter ablation.  I suspect we will continue with the plan for atrial fibrillation ablation but given the improvement in his symptom burden, I would like to document his heart rhythms using another 3 heart monitor.  Plan for 7-day [employer] with sensitive skin patch.  I will see him back in clinic in 4 to 5 weeks to review the results and finalize treatment plan.  #Coronary artery disease Continue statin  #Hypertension At goal today.  Recommend checking blood pressures 1-2 times per week at home and recording the values.  Recommend bringing these recordings to the primary care physician.  #Suspected sleep apnea The patient reports heavy snoring.  He has not had a sleep study before.  Will get a home sleep study set up form.  I discussed the link between untreated sleep apnea and atrial fibrillation treatment success rates during today's clinic appointment.  Follow-up with me in 4 to 5 weeks.  Signed, Ole Holts, MD, [facility], [facility] 12/08/2023 3:53 PM    Electrophysiology [facility]

## 2023-12-08 NOTE — Telephone Encounter (Signed)
 DR. KARLENE ROSA BIBLES SLEEP STUDY.   Patient agreement reviewed and signed on 12/08/2023.  WatchPAT issued to patient on 12/08/2023 by Niels Jest, CMA. Patient aware to not open the WatchPAT box until contacted with the activation PIN. Patient profile initialized in CloudPAT on 12/08/2023 by NIELS JEST, CMA. Device serial number: [id number]  Please list Reason for Call as Advice Only and type WatchPAT issued to patient in the comment box.

## 2023-12-08 NOTE — Patient Instructions (Addendum)
 Medication Instructions:  Your physician recommends that you continue on your current medications as directed. Please refer to the Current Medication list given to you today.  *If you need a refill on your cardiac medications before your next appointment, please call your pharmacy*  Testing/Procedures: Event Monitor  Your physician has recommended that you wear an event monitor. Event monitors are medical devices that record the heart's electrical activity. Doctors most often us  these monitors to diagnose arrhythmias. Arrhythmias are problems with the speed or rhythm of the heartbeat. The monitor is a small, portable device. You can wear one while you do your normal daily activities. This is usually used to diagnose what is causing palpitations/syncope (passing out).  Itamar Your physician has recommended that you have a sleep study. This test records several body functions during sleep, including: brain activity, eye movement, oxygen and carbon dioxide blood levels, heart rate and rhythm, breathing rate and rhythm, the flow of air through your mouth and nose, snoring, body muscle movements, and chest and belly movement.  Follow-Up: At [facility], you and your health needs are our priority.  As part of our continuing mission to provide you with exceptional heart care, we have created designated Provider Care Teams.  These Care Teams include your primary Cardiologist (physician) and Advanced Practice Providers (APPs -  Physician Assistants and Nurse Practitioners) who all work together to provide you with the care you need, when you need it.  Your next appointment:   4-5 weeks  Provider:   Ole Holts, MD

## 2024-01-03 IMAGING — CT CT CARDIAC CORONARY ARTERY CALCIUM SCORE
3 series · 14 of 20 positions shown, 16 images · non-contrast
Comparison: None Available.
COMPARISON: None Available.

Addendum:
CLINICAL DATA: This over-read does not include interpretation of
cardiac or coronary anatomy or pathology. The coronary calcium score
interpretation by the cardiologist is attached.
TECHNIQUE: A gated, non-contrast computed tomography scan of the heart was
performed using 3mm slice thickness. Axial images were analyzed on a
dedicated workstation. Calcium scoring of the coronary arteries was
performed using the Agatston method.

[Series 2: cascseq 2.0 sa36 (id) (id) · axial · 0.46mm/px · z∈[-290,-200]mm · 4 of 76 slices shown]
[im 16/76  vessel]
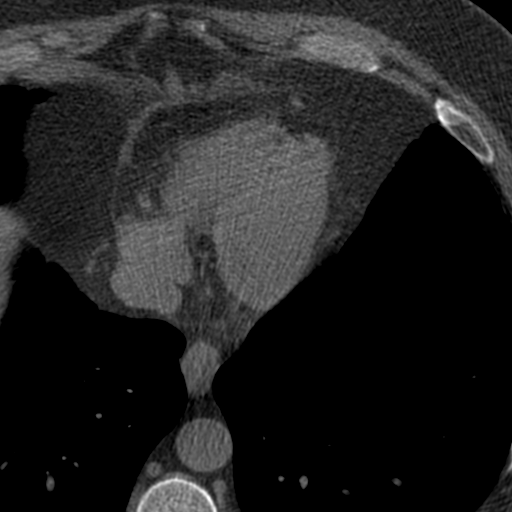
[im 31/76  vessel]
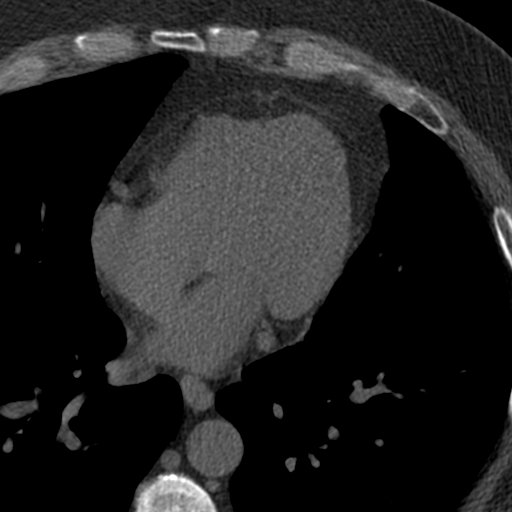
[im 46/76  vessel]
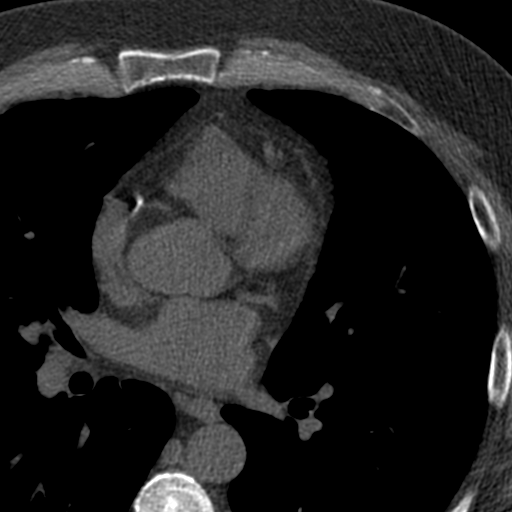
[im 61/76  vessel]
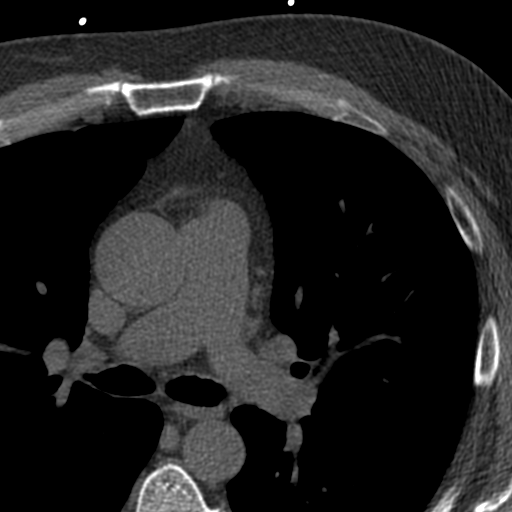

[Series 3: cascseq 2.0 bf37 st · axial · 0.77mm/px · z∈[-296,-196]mm · 5 of 76 slices shown, 7 images]
[im 13/76  vessel]
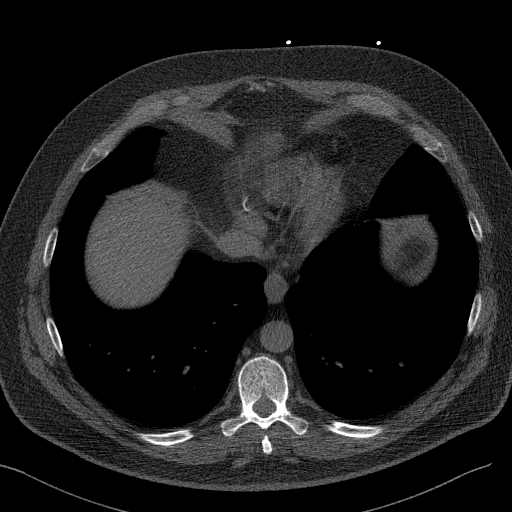
[im 13/76  lung]
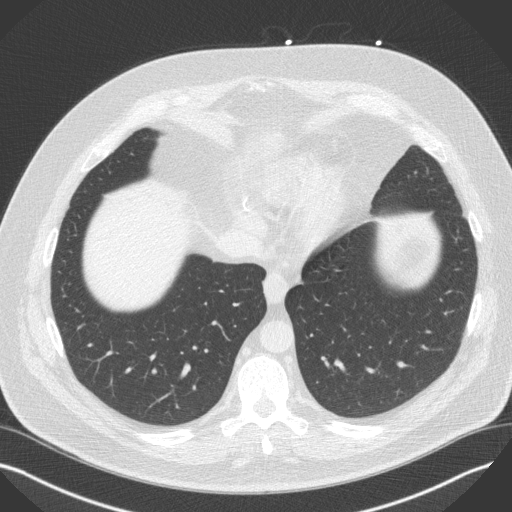
[im 26/76  vessel]
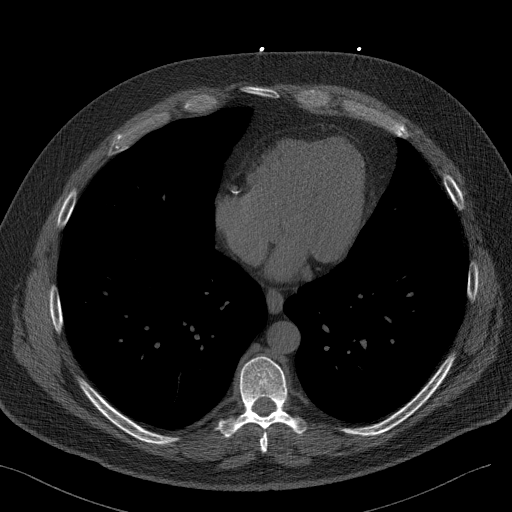
[im 38/76  vessel]
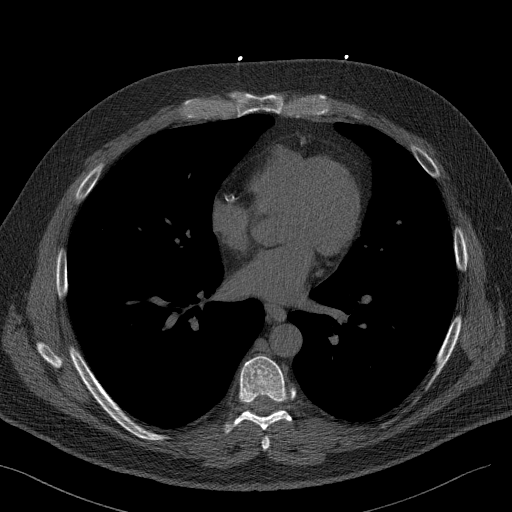
[im 51/76  vessel]
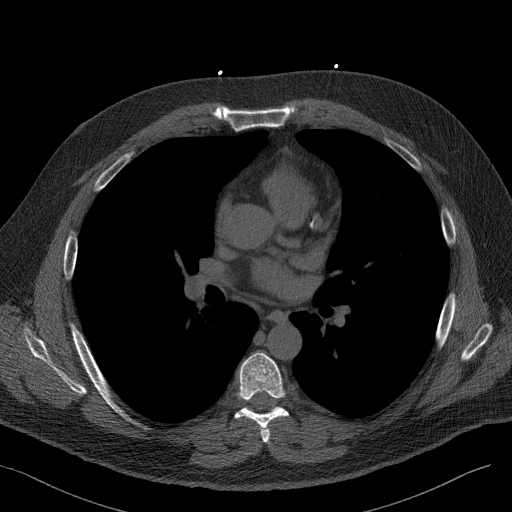
[im 63/76  vessel]
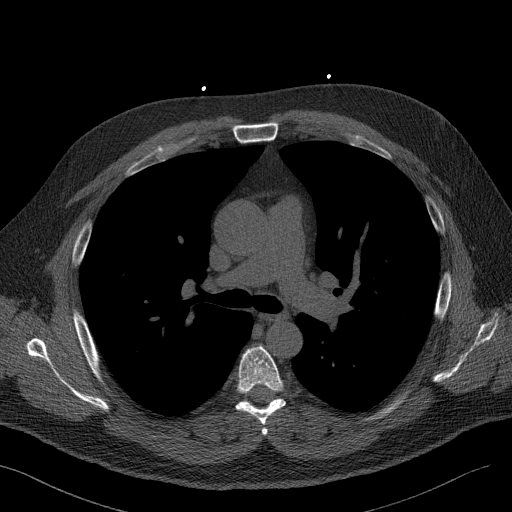
[im 63/76  lung]
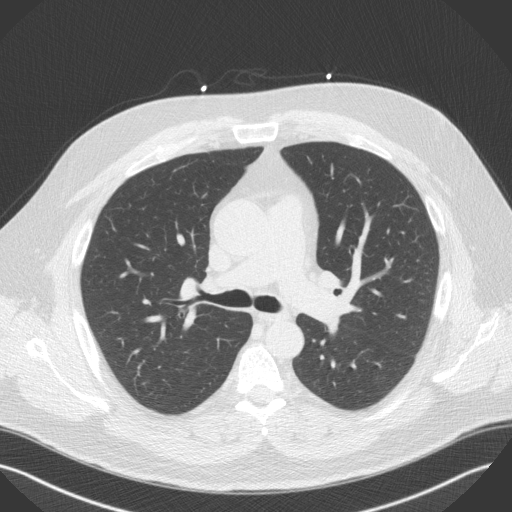

[Series 4: cascseq 2.0 br59 lung · axial · 0.77mm/px · z∈[-296,-196]mm · 5 of 76 slices shown]
[im 13/76  lung]
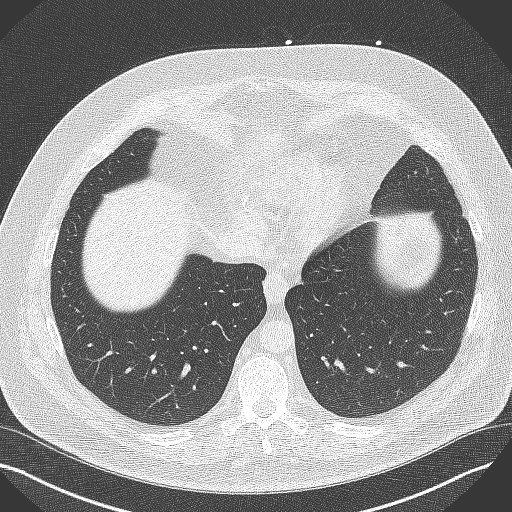
[im 26/76  lung]
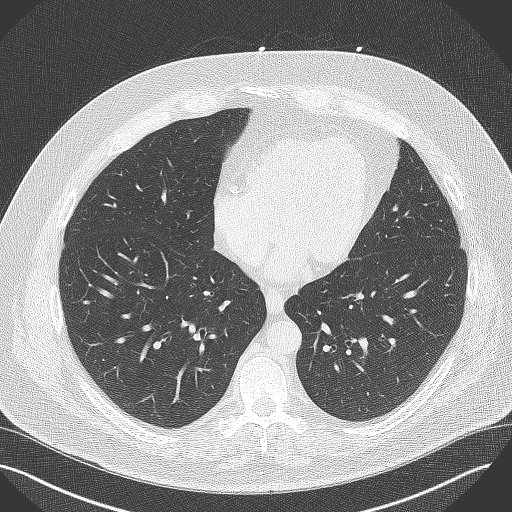
[im 38/76  lung]
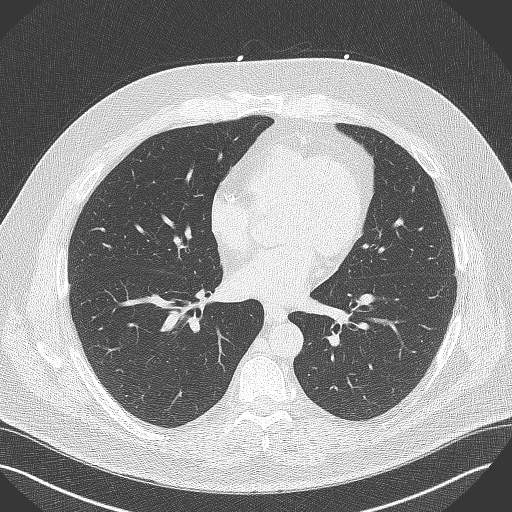
[im 51/76  lung]
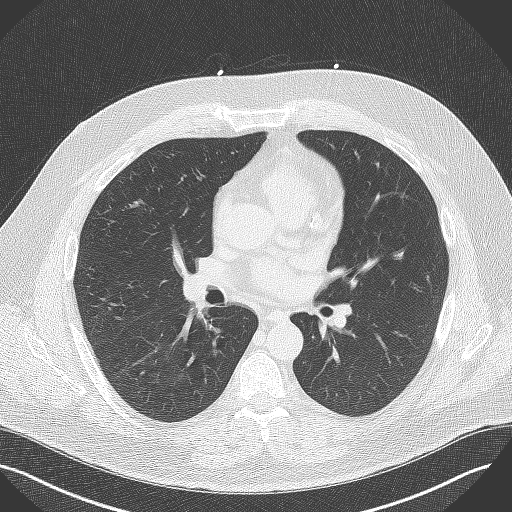
[im 63/76  lung]
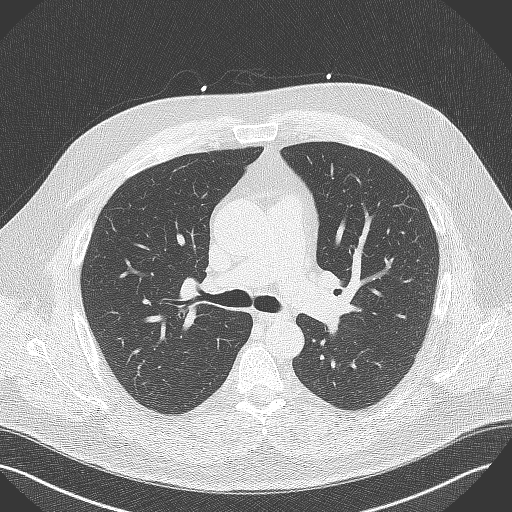

[14 of 20 positions shown; findings below may reference images not displayed]

FINDINGS: Vascular: Mild fusiform aneurysmal dilatation of the ascending
thoracic aorta measuring a maximum 3.9 cm.

Mediastinum/Nodes: No mediastinal or hilar mass or lymphadenopathy
the. The visualized esophagus is grossly normal.

Lungs/Pleura: No acute pulmonary findings. No worrisome pulmonary
lesions or pulmonary nodules.

Upper Abdomen: No significant upper abdominal findings.

Musculoskeletal: No significant bony findings.
IMPRESSION: 1. Mild fusiform aneurysmal dilatation of the ascending thoracic
aorta measuring a maximum of 3.9 cm.
2. No other significant findings.

ADDENDUM:
Cardiovascular Disease Risk stratification

EXAM:
Coronary Calcium Score
FINDINGS: Coronary arteries: Normal origins.

Coronary Calcium Score:

Left main: 0

Left anterior descending artery: 126

Left circumflex artery:

Right coronary artery: 671

Total: 810

Percentile: 99

Pericardium: Normal.

Ascending Aorta: Normal caliber.

Non-cardiac: See separate report from [REDACTED].
IMPRESSION: Coronary calcium score of 810. This was 99 percentile for age-,
race-, and sex-matched controls.



If CAC=0, it is reasonable to withhold statin therapy and reassess
in 5 to 10 years, as long as higher risk conditions are absent
(diabetes mellitus, family history of premature CHD in first degree
relatives (males <55 years; females <65 years), cigarette smoking,
or LDL >=190 mg/dL).

If CAC is 1 to 99, it is reasonable to initiate statin therapy for
patients >=55 years of age.

If CAC is >=100 or >=75th percentile, it is reasonable to initiate
statin therapy at any age.

Cardiology referral should be considered for patients with CAC
scores >=400 or >=75th percentile.

*3670 AHA/ACC/AACVPR/AAPA/ABC/MOOLMAN/CHINMAYA/YAMAGUCHI/Twin/BT/ERPE/QUACH
Guideline on the Management of Blood Cholesterol: A Report of the
American College of Cardiology/American Heart Association Task Force
on Clinical Practice Guidelines. J Am Coll Cardiol.
1771;73(24):9127-9841.

Juel Damas, DO

The noncardiac portion of this study will be interpreted in separate
report by the radiologist.

*** End of Addendum ***
FINDINGS: Vascular: Mild fusiform aneurysmal dilatation of the ascending
thoracic aorta measuring a maximum 3.9 cm.

Mediastinum/Nodes: No mediastinal or hilar mass or lymphadenopathy
the. The visualized esophagus is grossly normal.

Lungs/Pleura: No acute pulmonary findings. No worrisome pulmonary
lesions or pulmonary nodules.

Upper Abdomen: No significant upper abdominal findings.

Musculoskeletal: No significant bony findings.
IMPRESSION: 1. Mild fusiform aneurysmal dilatation of the ascending thoracic
aorta measuring a maximum of 3.9 cm.
2. No other significant findings.

## 2024-01-07 ENCOUNTER — Telehealth: Payer: Self-pay

## 2024-01-07 NOTE — Telephone Encounter (Signed)
 Left message for patient to call back to find out if he has sent back his heart monitor. We will need results prior to his appointment with Dr. Lalla Brothers on 3/12

## 2024-01-12 ENCOUNTER — Ambulatory Visit: Payer: BC Managed Care – PPO | Admitting: Cardiology

## 2024-01-29 ENCOUNTER — Other Ambulatory Visit: Payer: Self-pay | Admitting: Medical

## 2024-02-21 ENCOUNTER — Telehealth: Payer: Self-pay

## 2024-02-21 NOTE — Telephone Encounter (Signed)
**Note De-Identified Misael Mcgaha Obfuscation** Ordering provider: Dr Marven Slimmer Associated diagnoses: Snoring-R06.83 and A-fib-I48.0  WatchPAT PA obtained on 02/21/2024 by Ltanya Bayley, Isabella Mao, LPN. Authorization: Per the [employer] website,  Prior authorization must be obtained for the following CPT codes: *95805, *95807, *[id number], *95810 and *69629. Home sleep testing (Itamar-HST) services don't require prior authorization from [facility].  Patient notified of PIN (1234) on 02/21/2024 Malik Paar Notification Method: phone-no answer so I left a message on the pts VM (Ok per [facility]) advising him of the [facility] One-Hst device is "1234". I also sent the pt a MYCHART message advising him of the WatchPAT One-Hst device is "1234".  Phone note routed to covering staff for follow-up.

## 2024-03-16 NOTE — Progress Notes (Deleted)
  Electrophysiology Office Follow up Visit Note:    Date:  03/16/2024   ID:  Mark Dorsey, DOB [birth year], MRN [id number]  PCP:  Francine Iron  [facility] Cardiologist:  None  [facility] Electrophysiologist:  Boyce Byes, MD    Interval History:     Mark Dorsey is a 48 y.o. male who presents for a follow up visit.   I last saw the patient December 08, 2023 for his atrial fibrillation.  At the last appointment we planned to reassess the burden of his atrial fibrillation.  I ordered a heart monitor.        Past medical, surgical, social and family history were reviewed.  ROS:   Please see the history of present illness.    All other systems reviewed and are negative.  EKGs/Labs/Other Studies Reviewed:    The following studies were reviewed today:          Physical Exam:    VS:  There were no vitals taken for this visit.    Wt Readings from Last 3 Encounters:  12/08/23 247 lb (112 kg)  11/11/23 252 lb 1.9 oz (114.4 kg)  08/13/23 256 lb 6.4 oz (116.3 kg)     GEN: no distress CARD: RRR, No MRG RESP: No IWOB. CTAB.      ASSESSMENT:    No diagnosis found. PLAN:    In order of problems listed above:  #Atrial fibrillation Continue Eliquis  for stroke prophylaxis Continue Nebivolol   #Hypertension *** goal today.  Recommend checking blood pressures 1-2 times per week at home and recording the values.  Recommend bringing these recordings to the primary care physician. Continue Nebivolol   Follow-up with EP APP in 1 year   Signed, Harvie Liner, MD, [facility], [facility] 03/16/2024 10:16 PM    Electrophysiology [facility]

## 2024-03-17 ENCOUNTER — Ambulatory Visit: Admitting: Cardiology

## 2024-03-17 DIAGNOSIS — I480 Paroxysmal atrial fibrillation: Secondary | ICD-10-CM

## 2024-03-17 DIAGNOSIS — I10 Essential (primary) hypertension: Secondary | ICD-10-CM

## 2024-04-28 ENCOUNTER — Ambulatory Visit: Attending: Cardiology | Admitting: Cardiology

## 2024-04-28 NOTE — Progress Notes (Deleted)
  Electrophysiology Office Follow up Visit Note:    Date:  04/28/2024   ID:  Mark Dorsey, DOB 08-Jul-1976, MRN [id number]  PCP:  Dorina Dallas RIGGERS  [facility] Cardiologist:  None  [facility] Electrophysiologist:  OLE ONEIDA HOLTS, MD    Interval History:     Mark Dorsey is a 48 y.o. male who presents for a follow up visit.   I last saw the patient December 08, 2023 for paroxysmal atrial fibrillation.  Also with a history of hypertension and coronary artery disease.  He is on Eliquis  for stroke prophylaxis.  At the last appointment we planned for a repeat heart monitor.  It looks like he did not wear this monitor.         Past medical, surgical, social and family history were reviewed.  ROS:   Please see the history of present illness.    All other systems reviewed and are negative.  EKGs/Labs/Other Studies Reviewed:    The following studies were reviewed today:          Physical Exam:    VS:  There were no vitals taken for this visit.    Wt Readings from Last 3 Encounters:  12/08/23 247 lb (112 kg)  11/11/23 252 lb 1.9 oz (114.4 kg)  08/13/23 256 lb 6.4 oz (116.3 kg)     GEN: no distress CARD: RRR, No MRG RESP: No IWOB. CTAB.      ASSESSMENT:    No diagnosis found. PLAN:    In order of problems listed above:  #Atrial fibrillation Previously scheduled for catheter ablation but did not attend preop appointments and thus the procedure was canceled.  He is on Eliquis  for stroke prophylaxis.  #Hypertension *** goal today.  Recommend checking blood pressures 1-2 times per week at home and recording the values.  Recommend bringing these recordings to the primary care physician.    Signed, OLE HOLTS, MD, [facility], [facility] 04/28/2024 7:38 AM    Electrophysiology [facility]

## 2024-04-30 ENCOUNTER — Other Ambulatory Visit: Payer: Self-pay | Admitting: Medical

## 2024-04-30 ENCOUNTER — Other Ambulatory Visit: Payer: Self-pay | Admitting: Cardiology

## 2024-05-01 NOTE — Telephone Encounter (Signed)
 Prescription refill request for Eliquis  received. Indication:afib Last office visit:2/25 Scr:1.11  9/24 Age: 48 Weight:112  kg  Prescription refilled

## 2024-06-29 ENCOUNTER — Other Ambulatory Visit: Payer: Self-pay | Admitting: Cardiology

## 2024-06-29 DIAGNOSIS — I10 Essential (primary) hypertension: Secondary | ICD-10-CM

## 2024-06-29 DIAGNOSIS — I480 Paroxysmal atrial fibrillation: Secondary | ICD-10-CM

## 2024-07-19 ENCOUNTER — Other Ambulatory Visit: Payer: Self-pay

## 2024-07-19 DIAGNOSIS — E782 Mixed hyperlipidemia: Secondary | ICD-10-CM

## 2024-07-19 DIAGNOSIS — I2510 Atherosclerotic heart disease of native coronary artery without angina pectoris: Secondary | ICD-10-CM

## 2024-07-19 MED ORDER — ROSUVASTATIN CALCIUM 20 MG PO TABS: 20.0000 mg | ORAL_TABLET | Freq: Every day | ORAL | 0 refills | Status: DC

## 2024-08-03 ENCOUNTER — Other Ambulatory Visit: Payer: Self-pay | Admitting: Medical

## 2024-08-13 ENCOUNTER — Other Ambulatory Visit: Payer: Self-pay | Admitting: Medical

## 2024-08-29 ENCOUNTER — Telehealth: Payer: Self-pay | Admitting: Medical

## 2024-08-29 NOTE — Telephone Encounter (Signed)
 Overdue for appt. Can you try reaching out to Pt please?

## 2024-08-30 NOTE — Telephone Encounter (Signed)
Pt called and lvm to return call to schedule an appointment 

## 2024-09-04 ENCOUNTER — Telehealth: Payer: Self-pay

## 2024-09-04 ENCOUNTER — Other Ambulatory Visit: Payer: Self-pay | Admitting: Medical

## 2024-09-04 NOTE — Telephone Encounter (Signed)
 Pt sent in rx request tried to call pt to schedule him for an appointment seeing that's its been over a year but received no answer. Left vm for him to call and schedule soon

## 2024-09-05 ENCOUNTER — Other Ambulatory Visit: Payer: Self-pay | Admitting: Medical

## 2024-09-22 ENCOUNTER — Telehealth: Payer: Self-pay

## 2024-09-22 ENCOUNTER — Other Ambulatory Visit: Payer: Self-pay | Admitting: Medical

## 2024-09-22 NOTE — Telephone Encounter (Signed)
 Called pt and notified him that he needs an appointment in order to continue medication. 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 Says he has to get in contact with her to see when he can get here. I sent in a refill for 2 weeks but also advised him that we wont be able to fill next time unless he has been seen

## 2024-10-06 ENCOUNTER — Other Ambulatory Visit: Payer: Self-pay | Admitting: Cardiology

## 2024-10-06 DIAGNOSIS — I480 Paroxysmal atrial fibrillation: Secondary | ICD-10-CM

## 2024-10-06 DIAGNOSIS — I10 Essential (primary) hypertension: Secondary | ICD-10-CM

## 2024-10-09 ENCOUNTER — Telehealth: Payer: Self-pay | Admitting: Medical

## 2024-10-09 ENCOUNTER — Other Ambulatory Visit: Payer: Self-pay | Admitting: Medical

## 2024-10-09 NOTE — Telephone Encounter (Unsigned)
 Copied from CRM #[id number]. Topic: Clinical - Medication Refill >> Oct 09, 2024  4:54 PM Ashley R wrote: Medication: Venlafaxine  HCl 37.5 MG  Scheduled appt for 11/08/24  Has the patient contacted their pharmacy? Yes  This is the patient's preferred pharmacy:  [facility] [address] - [place], [place] - [address] [place] [address] Phone: [phone] Fax: [phone]  Is this the correct pharmacy for this prescription? Yes If no, delete pharmacy and type the correct one.   Has the prescription been filled recently? Yes  Is the patient out of the medication? Yes  Has the patient been seen for an appointment in the last year OR does the patient have an upcoming appointment? Yes  Can we respond through MyChart? Yes  Agent: Please be advised that Rx refills may take up to 3 business days. We ask that you follow-up with your pharmacy.

## 2024-10-11 ENCOUNTER — Other Ambulatory Visit: Payer: Self-pay

## 2024-10-11 DIAGNOSIS — I2510 Atherosclerotic heart disease of native coronary artery without angina pectoris: Secondary | ICD-10-CM

## 2024-10-11 DIAGNOSIS — E782 Mixed hyperlipidemia: Secondary | ICD-10-CM

## 2024-10-11 MED ORDER — ROSUVASTATIN CALCIUM 20 MG PO TABS: 20.0000 mg | ORAL_TABLET | Freq: Every day | ORAL | 0 refills | Status: AC

## 2024-11-01 ENCOUNTER — Other Ambulatory Visit: Payer: Self-pay | Admitting: Medical

## 2024-11-08 ENCOUNTER — Ambulatory Visit: Payer: Self-pay | Admitting: Medical
